# Patient Record
Sex: Male | Born: 1960 | Race: White | Hispanic: No | Marital: Married | Smoking: Never smoker
Health system: Southern US, Community
[De-identification: ages and names within clinical notes are randomized; demographics above are authoritative.]

## PROBLEM LIST (undated history)

## (undated) DIAGNOSIS — C61 Malignant neoplasm of prostate: Secondary | ICD-10-CM

## (undated) DIAGNOSIS — I10 Essential (primary) hypertension: Secondary | ICD-10-CM

## (undated) DIAGNOSIS — J45909 Unspecified asthma, uncomplicated: Secondary | ICD-10-CM

## (undated) HISTORY — PX: VARICOCELE EXCISION: SUR582

## (undated) HISTORY — PX: PROSTATE BIOPSY: SHX241

## (undated) HISTORY — PX: HERNIA REPAIR: SHX51

---

## 2000-08-25 ENCOUNTER — Ambulatory Visit (HOSPITAL_COMMUNITY): Admission: RE | Admit: 2000-08-25 | Discharge: 2000-08-25 | Payer: Self-pay | Admitting: Gastroenterology

## 2000-09-23 ENCOUNTER — Encounter: Payer: Self-pay | Admitting: Surgery

## 2000-09-26 ENCOUNTER — Inpatient Hospital Stay (HOSPITAL_COMMUNITY): Admission: RE | Admit: 2000-09-26 | Discharge: 2000-09-28 | Payer: Self-pay | Admitting: Surgery

## 2014-06-13 ENCOUNTER — Other Ambulatory Visit: Payer: Self-pay | Admitting: Family Medicine

## 2014-06-13 DIAGNOSIS — R946 Abnormal results of thyroid function studies: Secondary | ICD-10-CM

## 2014-06-27 ENCOUNTER — Ambulatory Visit
Admission: RE | Admit: 2014-06-27 | Discharge: 2014-06-27 | Disposition: A | Discharge status: Home / Self Care | Payer: 59 | Source: Ambulatory Visit | Attending: Family Medicine | Admitting: Family Medicine

## 2014-06-27 DIAGNOSIS — R946 Abnormal results of thyroid function studies: Secondary | ICD-10-CM

## 2015-03-17 DIAGNOSIS — L80 Vitiligo: Secondary | ICD-10-CM | POA: Insufficient documentation

## 2015-06-24 ENCOUNTER — Encounter (HOSPITAL_COMMUNITY): Payer: Self-pay | Admitting: *Deleted

## 2015-06-24 ENCOUNTER — Emergency Department (HOSPITAL_COMMUNITY)
Admission: EM | Admit: 2015-06-24 | Discharge: 2015-06-24 | Disposition: A | Discharge status: Home / Self Care | Payer: Managed Care, Other (non HMO) | Attending: Emergency Medicine | Admitting: Emergency Medicine

## 2015-06-24 DIAGNOSIS — W28XXXA Contact with powered lawn mower, initial encounter: Secondary | ICD-10-CM | POA: Diagnosis not present

## 2015-06-24 DIAGNOSIS — Z87891 Personal history of nicotine dependence: Secondary | ICD-10-CM | POA: Diagnosis not present

## 2015-06-24 DIAGNOSIS — Y9289 Other specified places as the place of occurrence of the external cause: Secondary | ICD-10-CM | POA: Insufficient documentation

## 2015-06-24 DIAGNOSIS — Y9389 Activity, other specified: Secondary | ICD-10-CM | POA: Insufficient documentation

## 2015-06-24 DIAGNOSIS — S61213A Laceration without foreign body of left middle finger without damage to nail, initial encounter: Secondary | ICD-10-CM | POA: Diagnosis present

## 2015-06-24 DIAGNOSIS — Z23 Encounter for immunization: Secondary | ICD-10-CM | POA: Insufficient documentation

## 2015-06-24 DIAGNOSIS — I10 Essential (primary) hypertension: Secondary | ICD-10-CM | POA: Insufficient documentation

## 2015-06-24 DIAGNOSIS — Y998 Other external cause status: Secondary | ICD-10-CM | POA: Insufficient documentation

## 2015-06-24 HISTORY — DX: Essential (primary) hypertension: I10

## 2015-06-24 MED ORDER — TETANUS-DIPHTH-ACELL PERTUSSIS 5-2.5-18.5 LF-MCG/0.5 IM SUSP
0.5000 mL | Freq: Once | INTRAMUSCULAR | Status: AC
  Administered 2015-06-24: 0.5 mL via INTRAMUSCULAR
  Filled 2015-06-24: qty 0.5

## 2015-06-24 MED ORDER — OXYCODONE-ACETAMINOPHEN 5-325 MG PO TABS
2.0000 | ORAL_TABLET | Freq: Once | ORAL | Status: AC
  Administered 2015-06-24: 2 via ORAL
  Filled 2015-06-24: qty 2

## 2015-06-24 MED ORDER — CEPHALEXIN 500 MG PO CAPS: 500.0000 mg | ORAL_CAPSULE | Freq: Four times a day (QID) | ORAL | Status: DC

## 2015-06-24 MED ORDER — OXYCODONE-ACETAMINOPHEN 5-325 MG PO TABS: 2.0000 | ORAL_TABLET | Freq: Three times a day (TID) | ORAL | Status: DC | PRN

## 2015-06-24 NOTE — Discharge Instructions (Signed)
Fingertip Injuries and Amputations Follow-up with hand surgery. Take Tylenol or Motrin for pain and Percocet for breakthrough pain. Fingertip injuries are common and often get injured because they are last to escape when pulling your hand out of harm's way. You have amputated (cut off) part of your finger. How this turns out depends largely on how much was amputated. If just the tip is amputated, often the end of the finger will grow back and the finger may return to much the same as it was before the injury.  If more of the finger is missing, your caregiver has done the best with the tissue remaining to allow you to keep as much finger as is possible. Your caregiver after checking your injury has tried to leave you with a painless fingertip that has durable, feeling skin. If possible, your caregiver has tried to maintain the finger's length and appearance and preserve its fingernail.  Please read the instructions outlined below and refer to this sheet in the next few weeks. These instructions provide you with general information on caring for yourself. Your caregiver may also give you specific instructions. While your treatment has been done according to the most current medical practices available, unavoidable complications occasionally occur. If you have any problems or questions after discharge, please call your caregiver. HOME CARE INSTRUCTIONS   You may resume normal diet and activities as directed or allowed.  Keep your hand elevated above the level of your heart. This helps decrease pain and swelling.  Keep ice packs (or a bag of ice wrapped in a towel) on the injured area for 15-20 minutes, 03-04 times per day, for the first two days.  Change dressings if necessary or as directed.  Clean the wound daily or as directed.  Only take over-the-counter or prescription medicines for pain, discomfort, or fever as directed by your caregiver.  Keep appointments as directed. SEEK IMMEDIATE MEDICAL  CARE IF:  You develop redness, swelling, numbness or increasing pain in the wound.  There is pus coming from the wound.  You develop an unexplained oral temperature above 102 F (38.9 C) or as your caregiver suggests.  There is a foul (bad) smell coming from the wound or dressing.  There is a breaking open of the wound (edges not staying together) after sutures or staples have been removed. MAKE SURE YOU:   Understand these instructions.  Will watch your condition.  Will get help right away if you are not doing well or get worse. Document Released: 09/18/2005 Document Revised: 01/20/2012 Document Reviewed: 08/17/2008 [facility] Patient Information 2015 [place], [place]. This information is not intended to replace advice given to you by your health care provider. Make sure you discuss any questions you have with your health care provider.

## 2015-06-24 NOTE — ED Notes (Signed)
Declined W/C at D/C and was escorted to lobby by RN. 

## 2015-06-24 NOTE — ED Notes (Signed)
Pt reports he put his fingers in lawn mower to get grass out.

## 2015-06-24 NOTE — ED Provider Notes (Signed)
History  This chart was scribed for non-physician practitioner, Ottie Glazier, PA-C,working with Lajean Saver, MD, by Marlowe Kays, ED Scribe. This patient was seen in room TR08C/TR08C and the patient's care was started at 2:23 PM.  Chief Complaint  Patient presents with  . Finger Injury   The history is provided by the patient and medical records. No language interpreter was used.    HPI Comments:  Kurt Marquez is a 54 y.o. male who presents to the Emergency Department complaining of a laceration to the third finger of the left hand secondary to pulling grass out of the lawn mower just PTA. He reports severe pain and associated bleeding that has been controlled. He has not taken anything for pain but washed the area with soap and water and applied antibiotic ointment.Marland Kitchen He denies modifying factors. He denies inability to move the fingers, numbness, tingling or weakness of the right hand or fingers, nausea or vomiting. He denies anticoagulant therapy. Pt is left hand dominant. Pt reports his tetanus vaccination is not UTD.  Past Medical History  Diagnosis Date  . Hypertension    History reviewed. No pertinent past surgical history. History reviewed. No pertinent family history. Social History  Substance Use Topics  . Smoking status: Former Research scientist (life sciences)  . Smokeless tobacco: Never Used  . Alcohol Use: No    Review of Systems  Gastrointestinal: Negative for nausea and vomiting.  Skin: Positive for wound.  Neurological: Negative for weakness and numbness.    Allergies  Ivp dye  Home Medications   Prior to Admission medications   Medication Sig Start Date End Date Taking? Authorizing Provider  cephALEXin (KEFLEX) 500 MG capsule Take 1 capsule (500 mg total) by mouth 4 (four) times daily. 06/24/15   Ryenne Lynam Patel-Mills, PA-C  oxyCODONE-acetaminophen (PERCOCET) 5-325 MG per tablet Take 2 tablets by mouth every 8 (eight) hours as needed for severe pain. 06/24/15   Ottie Glazier,  PA-C   Triage Vitals: BP 113/82 mmHg  Pulse 110  Temp(Src) 97.8 F (36.6 C) (Oral)  Resp 20  Ht 5\' 11"  (1.803 m)  Wt 248 lb (112.492 kg)  BMI 34.60 kg/m2  SpO2 97% Physical Exam  Constitutional: He is oriented to person, place, and time. He appears well-developed and well-nourished.  HENT:  Head: Normocephalic and atraumatic.  Eyes: EOM are normal.  Neck: Normal range of motion.  Cardiovascular: Normal rate.   Radial pulse 2+ of LUE  Pulmonary/Chest: Effort normal.  Musculoskeletal: Normal range of motion.  Laceration noted to the tip of the left third digit involving only tip of finger with no bone exposed, only tissue. Nail intact. Able to flex and extend all fingers of the left hand. No pallor to finger.  Neurological: He is alert and oriented to person, place, and time.  Skin: Skin is warm and dry.  Psychiatric: He has a normal mood and affect. His behavior is normal.  Nursing note and vitals reviewed.   ED Course  Procedures (including critical care time) DIAGNOSTIC STUDIES: Oxygen Saturation is 97% on RA, normal by my interpretation.   COORDINATION OF CARE: 2:30 PM- Will dress wound and consult hand specialist. Will update tetanus. Pt verbalizes understanding and agrees to plan.  Medications  oxyCODONE-acetaminophen (PERCOCET/ROXICET) 5-325 MG per tablet 2 tablet (2 tablets Oral Given 06/24/15 1509)  Tdap (BOOSTRIX) injection 0.5 mL (0.5 mLs Intramuscular Given 06/24/15 1509)    Labs Review Labs Reviewed - No data to display  Imaging Review No results found.  EKG Interpretation None      MDM   Final diagnoses:  Laceration of left middle finger w/o foreign body w/o damage to nail, initial encounter  Patient presents for finger tip avulsion. The wound was thoroughly cleaned and soaked in saline.  I spoke to Dr. Caralyn Guile regarding the plan for the patient and he agrees with xeroform and wrapping the finger.  I put the patient on keflex and gave him percocet  for pain.  I discussed return precautions with the patient and he verbally agrees with the plan.   Medications  oxyCODONE-acetaminophen (PERCOCET/ROXICET) 5-325 MG per tablet 2 tablet (2 tablets Oral Given 06/24/15 1509)  Tdap (BOOSTRIX) injection 0.5 mL (0.5 mLs Intramuscular Given 06/24/15 1509)   I personally performed the services described in this documentation, which was scribed in my presence. The recorded information has been reviewed and is accurate.    Ottie Glazier, PA-C 06/24/15 1817  Lajean Saver, MD 06/25/15 [phone]

## 2016-02-02 DIAGNOSIS — L249 Irritant contact dermatitis, unspecified cause: Secondary | ICD-10-CM | POA: Insufficient documentation

## 2016-02-02 DIAGNOSIS — L509 Urticaria, unspecified: Secondary | ICD-10-CM | POA: Insufficient documentation

## 2018-06-03 DIAGNOSIS — B078 Other viral warts: Secondary | ICD-10-CM | POA: Insufficient documentation

## 2018-07-15 ENCOUNTER — Ambulatory Visit
Admission: RE | Admit: 2018-07-15 | Discharge: 2018-07-15 | Disposition: A | Discharge status: Home / Self Care | Payer: 59 | Source: Ambulatory Visit | Attending: Family Medicine | Admitting: Family Medicine

## 2018-07-15 ENCOUNTER — Other Ambulatory Visit: Payer: Self-pay | Admitting: Family Medicine

## 2018-07-15 DIAGNOSIS — M79672 Pain in left foot: Secondary | ICD-10-CM

## 2018-10-13 DIAGNOSIS — M722 Plantar fascial fibromatosis: Secondary | ICD-10-CM | POA: Insufficient documentation

## 2018-10-13 DIAGNOSIS — M79672 Pain in left foot: Secondary | ICD-10-CM | POA: Insufficient documentation

## 2019-07-22 DIAGNOSIS — M546 Pain in thoracic spine: Secondary | ICD-10-CM | POA: Insufficient documentation

## 2019-09-06 DIAGNOSIS — M25511 Pain in right shoulder: Secondary | ICD-10-CM | POA: Insufficient documentation

## 2019-11-16 ENCOUNTER — Encounter: Payer: Self-pay | Admitting: *Deleted

## 2019-11-16 ENCOUNTER — Other Ambulatory Visit: Payer: Self-pay | Admitting: Urology

## 2019-11-16 DIAGNOSIS — C61 Malignant neoplasm of prostate: Secondary | ICD-10-CM

## 2019-11-23 ENCOUNTER — Ambulatory Visit: Payer: 59

## 2019-11-23 ENCOUNTER — Ambulatory Visit: Payer: 59 | Admitting: Radiation Oncology

## 2019-11-29 NOTE — Progress Notes (Signed)
GU Location of Tumor / Histology: prostatic adenocarcinoma  If Prostate Cancer, Gleason Score is (3 + 4) and PSA is (2.39). Prostate volume: 60g  LOGYN CRAIG had two cases of prostatitis in 2007. Then, an abnormal DRE prompted a prostate biopsy on 11/03/2019.  Biopsies of prostate (if applicable) revealed:   Past/Anticipated interventions by urology, if any: prostate biopsy, referral to Dr. Alinda Money to discuss surgery, referral to Dr. Tammi Klippel reference to discuss radiotherapy options  Past/Anticipated interventions by medical oncology, if any: no  Weight changes, if any: no  Bowel/Bladder complaints, if any: IPSS 1. SHIM 25. Denies dysuria, hematuria, urinary leakage or incontinence.  Reports inflamed hemorrhoids. Reports bright red blood in bowel movements.   Nausea/Vomiting, if any: no  Pain issues, if any:  Related to hemorrhoids. Reports using wife's hydrocortisone 2.5 % cream 2-4 times daily for relief. Questions if Dr. Tammi Klippel will provide him his own prescription.  SAFETY ISSUES:  Prior radiation? no  Pacemaker/ICD? no  Possible current pregnancy? no, male patient  Is the patient on methotrexate? no  Current Complaints / other details:  59 year old male. Married. Father dx at the age of 20. Patient's father had a seed implant with complications. Maternal uncle had pancreatic cancer.  Scheduled to speak with Dr. Alinda Money about surgical options at 1100.  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 Patient questions if he will be able to continue to do this.  Sander Nephew

## 2019-11-30 ENCOUNTER — Ambulatory Visit
Admission: RE | Admit: 2019-11-30 | Discharge: 2019-11-30 | Disposition: A | Discharge status: Home / Self Care | Payer: Managed Care, Other (non HMO) | Source: Ambulatory Visit | Attending: Radiation Oncology | Admitting: Radiation Oncology

## 2019-11-30 ENCOUNTER — Other Ambulatory Visit: Payer: Self-pay

## 2019-11-30 ENCOUNTER — Encounter: Payer: Self-pay | Admitting: Medical Oncology

## 2019-11-30 ENCOUNTER — Encounter: Payer: Self-pay | Admitting: Radiation Oncology

## 2019-11-30 VITALS — Ht 70.0 in | Wt 255.0 lb

## 2019-11-30 DIAGNOSIS — C61 Malignant neoplasm of prostate: Secondary | ICD-10-CM

## 2019-11-30 HISTORY — DX: Malignant neoplasm of prostate: C61

## 2019-11-30 NOTE — Progress Notes (Signed)
See progress notes under physician encounter. 

## 2019-11-30 NOTE — Progress Notes (Signed)
Radiation Oncology         [phone] ________________________________  Initial outpatient Consultation - Conducted via Telephone due to current COVID-19 concerns for limiting patient exposure  Name: Kurt Marquez MRN: [id number]  Date: 11/30/2019  DOB: 05-25-1961  [id number], Kurt Luo, MD  Lawerance Cruel, MD   REFERRING PHYSICIAN: Lawerance Cruel, MD  DIAGNOSIS: 59 y.o. 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    ICD-10-CM   1. Malignant neoplasm of prostate ([place])  C61     HISTORY OF PRESENT ILLNESS: Kurt Marquez is a 59 y.o. male with a diagnosis of prostate cancer. He was noted to have a prostate nodule by his primary care physician, Dr. Harrington Challenger. His PSA, however, has remained normal, most recently at 2.39.  Accordingly, he was referred for evaluation in urology by Dr. Karsten Ro on 10/14/2019,  digital rectal examination was performed at that time revealing a firm, linear ridge running the length of his right lobe in the mid to lateral aspect. Based on his abnormal DRE, he was recommended to proceed with prostate biopsy but elected to seek a second opinion from Dr. Jacalyn Lefevre and following that visit, agreed to proceed with biopsy. The patient proceeded to transrectal ultrasound with 12 biopsies of the prostate on 11/03/2019.  The prostate volume measured 60.21 cc.  Out of 12 core biopsies, 3 were positive.  The maximum Gleason score was 3+4, and this was seen in the right apex with perineural invasion. Additionally, Gleason 3+3 was seen in the right mid and right apex lateral.  He is scheduled for a consult visit with Dr. Alinda Money later this morning to further discuss his treatment options, specifically robotic prostatectomy. He is also scheduled for prostate MRI on 12/28/2019 for pre-surgical planning.  He reports a family history of prostate cancer in his father who was diagnosed at age 60 and elected treatment with  radioactive seed implant. His father has not had a recurrence of his cancer but unfortunately developed complications several years following the seed implant procedure resulting in what sounds like a neurogenic bladder requiring chronic foley catheterization at this point.  The patient reviewed the biopsy results with his urologist and he has kindly been referred today for discussion of potential radiation treatment options.  PREVIOUS RADIATION THERAPY: No  PAST MEDICAL HISTORY:  Past Medical History:  Diagnosis Date  . Hypertension   . Prostate cancer ([place])       PAST SURGICAL HISTORY: Past Surgical History:  Procedure Laterality Date  . HERNIA REPAIR    . PROSTATE BIOPSY    . VARICOCELE EXCISION      FAMILY HISTORY:  Family History  Problem Relation Age of Onset  . Prostate cancer Father   . Pancreatic cancer Maternal Uncle 75  . Breast cancer Neg Hx   . Colon cancer Neg Hx     SOCIAL HISTORY:  Social History   Socioeconomic History  . Marital status: Married    Spouse name: Levada Dy  . Number of children: 3  . Years of education: Not on file  . Highest education level: Not on file  Occupational History  . Occupation: [employer]    Comment: trade manager/full time  Tobacco Use  . Smoking status: Never Smoker  . Smokeless tobacco: Never Used  Substance and Sexual Activity  . Alcohol use: No  . Drug use: No  . Sexual activity: Yes  Other Topics Concern  .  Not on file  Social History Narrative  . Not on file   Social Determinants of Health   Financial Resource Strain:   . Difficulty of Paying Living Expenses: Not on file  Food Insecurity:   . Worried About Charity fundraiser in the Last Year: Not on file  . Ran Out of Food in the Last Year: Not on file  Transportation Needs:   . Lack of Transportation (Medical): Not on file  . Lack of Transportation (Non-Medical): Not on file  Physical Activity:   . Days of Exercise per Week: Not on file  .  Minutes of Exercise per Session: Not on file  Stress:   . Feeling of Stress : Not on file  Social Connections:   . Frequency of Communication with Friends and Family: Not on file  . Frequency of Social Gatherings with Friends and Family: Not on file  . Attends Religious Services: Not on file  . Active Member of Clubs or Organizations: Not on file  . Attends Archivist Meetings: Not on file  . Marital Status: Not on file  Intimate Partner Violence:   . Fear of Current or Ex-Partner: Not on file  . Emotionally Abused: Not on file  . Physically Abused: Not on file  . Sexually Abused: Not on file    ALLERGIES: Ivp dye [iodinated diagnostic agents], Metrizamide, Iodine-131, and Pollen extract  MEDICATIONS:  Current Outpatient Medications  Medication Sig Dispense Refill  . clobetasol cream (TEMOVATE) 0.05 % Apply to affected areas on body and hands twice daily for 4 wks, then taper back to tacrolimus. Not to face.    Marland Kitchen econazole nitrate 1 % cream Apply to groin for itching daily to twice daily as needed for fungal outbreaks.    Marland Kitchen lisinopril (ZESTRIL) 20 MG tablet lisinopril 20 mg tablet    . meclizine (ANTIVERT) 25 MG tablet meclizine 25 mg tablet    . metroNIDAZOLE (METROGEL) 0.75 % gel APPLY TO FACE TWICE DAILY AS NEEDED    . omeprazole (PRILOSEC) 20 MG capsule Take by mouth.    . tacrolimus (PROTOPIC) 0.1 % ointment tacrolimus 0.1 % topical ointment  APPLY OINTMENT EXTERNALLY TO AFFECTED AREA TWICE DAILY MONDAY THROUGH FRIDAY    . triamcinolone cream (KENALOG) 0.1 % Apply topically 2 (two) times daily.     No current facility-administered medications for this encounter.    REVIEW OF SYSTEMS:  On review of systems, the patient reports that he is doing well overall. He denies any chest pain, shortness of breath, cough, fevers, chills, night sweats, unintended weight changes. He denies any bowel disturbances, and denies abdominal pain, nausea or vomiting. He denies any new  musculoskeletal or joint aches or pains. His IPSS was 1, indicating very mild to no urinary symptoms. He reports inflamed hemorrhoids, which cause bright red blood in his bowel movements. His SHIM was 25, indicating he does not have erectile dysfunction. A complete review of systems is obtained and is otherwise negative.    PHYSICAL EXAM:  Wt Readings from Last 3 Encounters:  11/30/19 255 lb (115.7 kg)  06/24/15 248 lb (112.5 kg)   Temp Readings from Last 3 Encounters:  06/24/15 97.7 F (36.5 C) (Oral)   BP Readings from Last 3 Encounters:  06/24/15 121/81   Pulse Readings from Last 3 Encounters:  06/24/15 89   Pain Assessment Pain Score: 0-No pain/10  Physical exam not performed in light of telephone consult visit format.   KPS = 100  100 - Normal; no complaints; no evidence of disease. 90   - Able to carry on normal activity; minor signs or symptoms of disease. 80   - Normal activity with effort; some signs or symptoms of disease. 4   - Cares for self; unable to carry on normal activity or to do active work. 60   - Requires occasional assistance, but is able to care for most of his personal needs. 50   - Requires considerable assistance and frequent medical care. 26   - Disabled; requires special care and assistance. 78   - Severely disabled; hospital admission is indicated although death not imminent. 73   - Very sick; hospital admission necessary; active supportive treatment necessary. 10   - Moribund; fatal processes progressing rapidly. 0     - Dead  Karnofsky DA, Abelmann WH, Craver LS and Burchenal JH [phone]) The use of the nitrogen mustards in the palliative treatment of carcinoma: with particular reference to bronchogenic carcinoma Cancer 1 634-56  LABORATORY DATA:  No results found for: WBC, HGB, HCT, MCV, PLT No results found for: NA, K, CL, CO2 No results found for: ALT, AST, GGT, ALKPHOS, BILITOT   RADIOGRAPHY: No results found.    IMPRESSION/PLAN: This  visit was conducted via Telephone to spare the patient unnecessary potential exposure in the healthcare setting during the current COVID-19 pandemic. 1. 59 y.o. gentleman with Stage T2a adenocarcinoma of the prostate with Gleason Score of 3+4, and PSA of 2.39. We discussed the patient's workup and outlined the nature of prostate cancer in this setting. The patient's T stage, Gleason's score, and PSA put him into the favorable intermediate risk group. Accordingly, he is eligible for a variety of potential treatment options including brachytherapy, 5.5 weeks of external radiation or prostatectomy. We discussed the available radiation techniques, and focused on the details and logistics of delivery. We discussed and outlined the risks, benefits, short and long-term effects associated with radiotherapy and compared and contrasted these with prostatectomy.  He and his wife were encouraged to ask questions that were answered to their stated satisfaction.  The patient focused most of his questions and interest in robotic-assisted laparoscopic radical prostatectomy.  We discussed some of the potential advantages of surgery including surgical staging, the availability of salvage radiotherapy to the prostatic fossa, and the confidence associated with immediate biochemical response.  We discussed some of the potential proven indications for postoperative radiotherapy including positive margins, extracapsular extension, and seminal vesicle involvement. We also talked about some of the other potential findings leading to a recommendation for radiotherapy including a non-zero postoperative PSA and positive lymph nodes.   At the end of the conversation the patient is leaning towards moving forward with prostatectomy and will discuss this further with Dr. Alinda Money at the time of his consult visit this morning. We will share our discussion with Dr. Claudia Desanctis and Dr. Alinda Money so that they can move forward with treatment planning  accordingly. We enjoyed meeting with him and his wife today, and will look forward to following his progress.  Of course, we would be more than happy to continue to participate in his care should he elect to proceed with radiotherapy now or should there be any clinical indication for adjuvant or salvage radiotherapy in the future. He knows that he is welcome to call at any time with any further questions or concerns.  Given current concerns for patient exposure during the COVID-19 pandemic, this encounter was conducted via telephone. The patient was notified in  advance and was offered a MyChart meeting to allow for face to face communication but unfortunately reported that he did not have the appropriate resources/technology to support such a visit and instead preferred to proceed with telephone consult. The patient has given verbal consent for this type of encounter. The time spent during this encounter was 60 minutes. The attendants for this meeting include Tyler Pita MD, Ashlyn Bruning PA-C, [place], and patient, ANDREWJAMES RODELA and his wife Morey Hummingbird. During the encounter, Tyler Pita MD, Ashlyn Bruning PA-C, and scribe, Wilburn Mylar were located at [place].  Patient, KAIHAN DIMARCO and his wife Morey Hummingbird were located at home.    Nicholos Johns, PA-C    Tyler Pita, MD  [facility] Direct Dial: [phone]  Fax: [phone] [email or web address]  Skype  [email or web address]  This document serves as a record of services personally performed by Tyler Pita, MD and Freeman Caldron, PA-C. It was created on their behalf by Wilburn Mylar, a trained medical scribe. The creation of this record is based on the scribe's personal observations and the provider's statements to them. This document has been checked and approved by the attending provider.

## 2019-12-01 DIAGNOSIS — C61 Malignant neoplasm of prostate: Secondary | ICD-10-CM | POA: Insufficient documentation

## 2019-12-06 ENCOUNTER — Other Ambulatory Visit: Payer: Self-pay | Admitting: Urology

## 2019-12-28 ENCOUNTER — Ambulatory Visit
Admission: RE | Admit: 2019-12-28 | Discharge: 2019-12-28 | Disposition: A | Discharge status: Home / Self Care | Payer: Managed Care, Other (non HMO) | Source: Ambulatory Visit | Attending: Urology | Admitting: Urology

## 2019-12-28 DIAGNOSIS — C61 Malignant neoplasm of prostate: Secondary | ICD-10-CM

## 2019-12-28 MED ORDER — GADOBENATE DIMEGLUMINE 529 MG/ML IV SOLN
20.0000 mL | Freq: Once | INTRAVENOUS | Status: AC | PRN
  Administered 2019-12-28: 20 mL via INTRAVENOUS

## 2020-01-05 NOTE — Patient Instructions (Addendum)
DUE TO COVID-19 ONLY ONE VISITOR IS ALLOWED TO COME WITH YOU AND STAY IN THE WAITING ROOM ONLY DURING PRE OP AND PROCEDURE DAY OF SURGERY. THE 1 VISITOR MAY VISIT WITH YOU AFTER SURGERY IN YOUR PRIVATE ROOM DURING VISITING HOURS ONLY!  YOU NEED TO HAVE A COVID 19 TEST ON 01-10-20 @ 1:05 PM, THIS TEST MUST BE DONE BEFORE SURGERY, COME  [address], [place] , [id number].  ([place]) ONCE YOUR COVID TEST IS COMPLETED, PLEASE BEGIN THE QUARANTINE INSTRUCTIONS AS OUTLINED IN YOUR HANDOUT.                RAIMON DEPPEN  01/05/2020   Your procedure is scheduled on: 01-13-20   Report to [facility] Main  Entrance    Report to Short Stay at 5:30 AM     Call this number if you have problems the morning of surgery [phone]    Remember: Do not eat food or drink liquids :After Midnight.     Take these medicines the morning of surgery with A SIP OF WATER: None   BRUSH YOUR TEETH MORNING OF SURGERY AND RINSE YOUR MOUTH OUT, NO CHEWING GUM CANDY OR MINTS.                                You may not have any metal on your body including hair pins and              piercings     Do not wear jewelry,cologne, lotions, powders or  deodorant                        Men may shave face and neck.   Do not bring valuables to the hospital. [place].  Contacts, dentures or bridgework may not be worn into surgery.  You may bring in overnight bag     Special Instructions: Please follow your prep, per your surgeon's instructions              Please read over the following fact sheets you were given: _____________________________________________________________________             [facility] - Preparing for Surgery Before surgery, you can play an important role.  Because skin is not sterile, your skin needs to be as free of germs as possible.  You can reduce the number of germs on your skin by washing with CHG  (chlorahexidine gluconate) soap before surgery.  CHG is an antiseptic cleaner which kills germs and bonds with the skin to continue killing germs even after washing. Please DO NOT use if you have an allergy to CHG or antibacterial soaps.  If your skin becomes reddened/irritated stop using the CHG and inform your nurse when you arrive at Short Stay. Do not shave (including legs and underarms) for at least 48 hours prior to the first CHG shower.  You may shave your face/neck. Please follow these instructions carefully:  1.  Shower with CHG Soap the night before surgery and the  morning of Surgery.  2.  If you choose to wash your hair, wash your hair first as usual with your  normal  shampoo.  3.  After you shampoo, rinse your hair and body thoroughly to remove the  shampoo.  4.  Use CHG as you would any other liquid soap.  You can apply chg directly  to the skin and wash                       Gently with a scrungie or clean washcloth.  5.  Apply the CHG Soap to your body ONLY FROM THE NECK DOWN.   Do not use on face/ open                           Wound or open sores. Avoid contact with eyes, ears mouth and genitals (private parts).                       Wash face,  Genitals (private parts) with your normal soap.             6.  Wash thoroughly, paying special attention to the area where your surgery  will be performed.  7.  Thoroughly rinse your body with warm water from the neck down.  8.  DO NOT shower/wash with your normal soap after using and rinsing off  the CHG Soap.                9.  Pat yourself dry with a clean towel.            10.  Wear clean pajamas.            11.  Place clean sheets on your bed the night of your first shower and do not  sleep with pets. Day of Surgery : Do not apply any lotions/deodorants the morning of surgery.  Please wear clean clothes to the hospital/surgery center.  FAILURE TO FOLLOW THESE INSTRUCTIONS MAY RESULT IN THE CANCELLATION OF  YOUR SURGERY PATIENT SIGNATURE_________________________________  NURSE SIGNATURE__________________________________  ________________________________________________________________________

## 2020-01-05 NOTE — Progress Notes (Signed)
PCP - Lona Kettle Cardiologist -   Chest x-ray -  EKG -  Stress Test -  ECHO -  Cardiac Cath -   Sleep Study -  CPAP -   Fasting Blood Sugar -  Checks Blood Sugar _____ times a day  Blood Thinner Instructions: Aspirin Instructions: Last Dose:  Anesthesia review:   Patient denies shortness of breath, fever, cough and chest pain at PAT appointment   Patient verbalized understanding of instructions that were given to them at the PAT appointment. Patient was also instructed that they will need to review over the PAT instructions again at home before surgery.

## 2020-01-06 ENCOUNTER — Other Ambulatory Visit: Payer: Self-pay

## 2020-01-06 ENCOUNTER — Encounter (HOSPITAL_COMMUNITY): Payer: Self-pay

## 2020-01-06 ENCOUNTER — Encounter (HOSPITAL_COMMUNITY)
Admission: RE | Admit: 2020-01-06 | Discharge: 2020-01-06 | Disposition: A | Discharge status: Home / Self Care | Payer: 59 | Source: Ambulatory Visit | Attending: Urology | Admitting: Urology

## 2020-01-06 DIAGNOSIS — Z01810 Encounter for preprocedural cardiovascular examination: Secondary | ICD-10-CM | POA: Diagnosis not present

## 2020-01-06 DIAGNOSIS — I10 Essential (primary) hypertension: Secondary | ICD-10-CM | POA: Insufficient documentation

## 2020-01-06 DIAGNOSIS — Z01812 Encounter for preprocedural laboratory examination: Secondary | ICD-10-CM | POA: Diagnosis not present

## 2020-01-06 HISTORY — DX: Unspecified asthma, uncomplicated: J45.909

## 2020-01-06 LAB — BASIC METABOLIC PANEL
Anion gap: 9 (ref 5–15)
BUN: 16 mg/dL (ref 6–20)
CO2: 24 mmol/L (ref 22–32)
Calcium: 9.1 mg/dL (ref 8.9–10.3)
Chloride: 106 mmol/L (ref 98–111)
Creatinine, Ser: 0.94 mg/dL (ref 0.61–1.24)
GFR calc Af Amer: >60 mL/min (ref >60)
GFR calc non Af Amer: >60 mL/min (ref >60)
Glucose, Bld: 102 mg/dL — ABNORMAL HIGH (ref 70–99)
Potassium: 4.3 mmol/L (ref 3.5–5.1)
Sodium: 139 mmol/L (ref 135–145)

## 2020-01-06 LAB — CBC
HCT: 46.8 % (ref 39.0–52.0)
Hemoglobin: 14.9 g/dL (ref 13.0–17.0)
MCH: 29 pg (ref 26.0–34.0)
MCHC: 31.8 g/dL (ref 30.0–36.0)
MCV: 91.2 fL (ref 80.0–100.0)
Platelets: 314 10*3/uL (ref 150–400)
RBC: 5.13 MIL/uL (ref 4.22–5.81)
RDW: 13.2 % (ref 11.5–15.5)
WBC: 7.2 10*3/uL (ref 4.0–10.5)
nRBC: 0 % (ref 0.0–0.2)

## 2020-01-06 LAB — ABO/RH: ABO/RH(D): A POS

## 2020-01-10 ENCOUNTER — Other Ambulatory Visit (HOSPITAL_COMMUNITY)
Admission: RE | Admit: 2020-01-10 | Discharge: 2020-01-10 | Disposition: A | Discharge status: Home / Self Care | Payer: 59 | Source: Ambulatory Visit | Attending: Urology | Admitting: Urology

## 2020-01-10 DIAGNOSIS — Z01812 Encounter for preprocedural laboratory examination: Secondary | ICD-10-CM | POA: Insufficient documentation

## 2020-01-10 DIAGNOSIS — Z20822 Contact with and (suspected) exposure to covid-19: Secondary | ICD-10-CM | POA: Insufficient documentation

## 2020-01-10 LAB — SARS CORONAVIRUS 2 (TAT 6-24 HRS): SARS Coronavirus 2: NEGATIVE

## 2020-01-12 NOTE — H&P (Signed)
CC: Prostate Cancer    Kurt Marquez is a 59 year old gentleman who was noted to have an abnormal DRE by Dr. Harrington Challenger during a routine physical exam. His PSA was 2.39. He did have a father who had prostate cancer treated with a radiation seed implantation at age 79. He was seen by Dr. Karsten Ro in consultation and was confirmed to have concerning firmness of the right mid lateral prostate gland. He was recommend to undergo a prostate biopsy. Kurt Marquez sought a second opinion by Dr. Claudia Desanctis who also recommended a prostate biopsy. He ultimately proceeded with a biopsy on 11/03/19. He was diagnosed with Gleason 3+4=7 adenocarcinoma with 3 out of 13 biopsies positive for malignancy. Only 5% of one core demonstrated pattern 4 disease. He is very well informed through his prior discussions with Dr. Claudia Desanctis and Dr. Tammi Klippel, who he talked with earlier this morning.   Family history: Father - treated with radiation seed implant at age 42   Imaging studies: He has been scheduled for an MRI of the prostate.   PMH: He has a history of hypertension, GERD, vertigo, and asthma. He has an IV CONTRAST ALLERGY.  PSH: Laparoscopic Nissen fundoplication and varicocelectomy.   TNM stage: cT2 Nx Mx  PSA: 2.39  Gleason score: 3+4=7 (Grade group 2)  Biopsy (11/03/19): 3/13 cores positive  Left: Benign  Right: R apex (44%, 3+4=7), R lateral apex (13%, 3+3=6), R mid (13%, 3+3=6)  Prostate volume: 60.2 cc  PSAD: 0.04   Nomogram  OC disease: 78%  EPE: 19%  SVI: 1%  LNI: 2%  PFS (5 year, 10 year): 93%, 88%   Urinary function: IPSS is 2.  Erectile function: SHIM score is 23.     ALLERGIES: Iodinated Contrast Media - Oral and IV Dye - overheated    MEDICATIONS: Doxycycline Hyclate  Lisinopril 20 mg tablet  Prilosec  Meclizine Hcl     GU PSH: Prostate Needle Biopsy - 11/03/2019     NON-GU PSH: Remove Leg Veins/lesion - 1985 Surgical Pathology, Gross And Microscopic Examination For Prostate Needle - 11/03/2019      GU PMH: Prostate Cancer, Discussed patient's pathology report which revealed Gleason 3 + 4 prostate cancer. We discussed the different nomenclature including grade groups and risk stratification group. Patient understands that he is in grade group 2 or favorable intermediate risk prostate cancer. Patient was given a copy of his pathology report, an informational handout on localized prostate cancer as well as [facility] pre medical prostatectomy nomogram. We discussed treatment options for the patient which would include active surveillance, radical prostatectomy or primary radiation. Patient is extremely nervous and would not be a good candidate for active surveillance. We discussed the details of her robotic radical prostatectomy and lymph node dissection including postoperative course, Foley catheter, urinary incontinence and erectile dysfunction. Patient is interested in gather more information and would like to talk to the radiation oncologist as well. In addition I will obtain MRI of the prostate in approximately 6 weeks for either surgical or radiation planning. - 11/15/2019 Prostate nodule w/o LUTS (Stable), I had an in-depth conversation with the patient's wife regarding PSA screening, abnormal DRE, PSA, prostate cancer, BPH. I answered all patient questions including the the timing and urgency of biopsy, treatment, affects, autoimmune disease, UV treatments, when to return to work, exercise, diet, intercourse. The encounter was greater than 40 minutes in length and over 50% of the encounter was spent counseling. Patient is scheduled for prostate biopsy On 11/03/2019 with Dr.  Ottelin. - 10/21/2019, Although I do not have any previous PSA results the patient reports there has been a slight elevation compared to his PSA previously. I told him that despite the fact that his PSA remains within the normal range he has change in his prostate exam that is fairly worrisome and therefore I have recommended further  evaluation with TRUS/Bx. He understands and has elected to proceed., - 10/14/2019 Family Hx of Prostate Cancer, His father has had prostate cancer that was clinically significant and treated with radioactive seeds when he was 3. - 10/14/2019    NON-GU PMH: Asthma GERD Hypertension    FAMILY HISTORY: Prostate Cancer - Father Thyroid - Mother   SOCIAL HISTORY: Marital Status: Married Current Smoking Status: Patient has never smoked.   Tobacco Use Assessment Completed: Used Tobacco in last 30 days? Has never drank.  Drinks 1 caffeinated drink per day. Patient's occupation Product/process development scientist trade [employer]    REVIEW OF SYSTEMS:    GU Review Male:   Patient reports leakage of urine. Patient denies frequent urination, hard to postpone urination, burning/ pain with urination, get up at night to urinate, stream starts and stops, trouble starting your streams, and have to strain to urinate .  Gastrointestinal (Lower):   Patient denies diarrhea and constipation.  Gastrointestinal (Upper):   Patient denies nausea and vomiting.  Constitutional:   Patient denies fever, night sweats, weight loss, and fatigue.  Skin:   Patient denies skin rash/ lesion and itching.  Eyes:   Patient denies blurred vision and double vision.  Ears/ Nose/ Throat:   Patient denies sore throat and sinus problems.  Hematologic/Lymphatic:   Patient denies swollen glands and easy bruising.  Cardiovascular:   Patient denies leg swelling and chest pains.  Respiratory:   Patient denies cough and shortness of breath.  Endocrine:   Patient denies excessive thirst.  Musculoskeletal:   Patient denies back pain and joint pain.  Neurological:   Patient denies headaches and dizziness.  Psychologic:   Patient denies depression and anxiety.   Notes: hemorrhoids     VITAL SIGNS:     Weight 155 lb / 70.31 kg  Height 70.5 in / 179.07 cm  BMI 21.9 kg/m    MULTI-SYSTEM PHYSICAL EXAMINATION:    Constitutional:  Well-nourished. No physical deformities. Normally developed. Good grooming.  Neck: Neck symmetrical, not swollen. Normal tracheal position.  Respiratory: No labored breathing, no use of accessory muscles. Clear bilaterally.  Cardiovascular: Normal temperature, normal extremity pulses, no swelling, no varicosities. Regular rate and rhythm.  Lymphatic: No enlargement of neck, axillae, groin.  Skin: No paleness, no jaundice, no cyanosis. No lesion, no ulcer, no rash.  Neurologic / Psychiatric: Oriented to time, oriented to place, oriented to person. No depression, no anxiety, no agitation.  Gastrointestinal: No mass, no tenderness, no rigidity, non obese abdomen.  Eyes: Normal conjunctivae. Normal eyelids.  Ears, Nose, Mouth, and Throat: Left ear no scars, no lesions, no masses. Right ear no scars, no lesions, no masses. Nose no scars, no lesions, no masses. Normal hearing. Normal lips.  Musculoskeletal: Normal gait and station of head and neck.        ASSESSMENT:      ICD-10 Details  1 GU:   Prostate Cancer - C61    PLAN:         1. Favorable intermediate risk prostate cancer:  He has made the decision that he would like to proceed with surgical therapy for his prostate cancer. He does  have an MRI scheduled for February 16th and will keep that appointment considering his palpable disease for preoperative planning purposes. He will tentatively be scheduled for a bilateral nerve-sparing robot assisted laparoscopic radical prostatectomy and bilateral pelvic lymph node dissection pending his MRI result.

## 2020-01-13 ENCOUNTER — Other Ambulatory Visit: Payer: Self-pay

## 2020-01-13 ENCOUNTER — Encounter (HOSPITAL_COMMUNITY)
Admission: RE | Disposition: A | Discharge status: Home / Self Care | Payer: Self-pay | Source: Home / Self Care | Attending: Urology

## 2020-01-13 ENCOUNTER — Ambulatory Visit (HOSPITAL_COMMUNITY): Payer: 59 | Admitting: Physician Assistant

## 2020-01-13 ENCOUNTER — Observation Stay (HOSPITAL_COMMUNITY)
Admission: RE | Admit: 2020-01-13 | Discharge: 2020-01-14 | Disposition: A | Discharge status: Home / Self Care | Payer: 59 | Attending: Urology | Admitting: Urology

## 2020-01-13 ENCOUNTER — Encounter (HOSPITAL_COMMUNITY): Payer: Self-pay | Admitting: Urology

## 2020-01-13 ENCOUNTER — Ambulatory Visit (HOSPITAL_COMMUNITY): Payer: 59 | Admitting: Certified Registered Nurse Anesthetist

## 2020-01-13 DIAGNOSIS — Z6835 Body mass index (BMI) 35.0-35.9, adult: Secondary | ICD-10-CM | POA: Diagnosis not present

## 2020-01-13 DIAGNOSIS — E669 Obesity, unspecified: Secondary | ICD-10-CM | POA: Diagnosis not present

## 2020-01-13 DIAGNOSIS — C61 Malignant neoplasm of prostate: Principal | ICD-10-CM | POA: Insufficient documentation

## 2020-01-13 DIAGNOSIS — J45909 Unspecified asthma, uncomplicated: Secondary | ICD-10-CM | POA: Insufficient documentation

## 2020-01-13 DIAGNOSIS — Z8042 Family history of malignant neoplasm of prostate: Secondary | ICD-10-CM | POA: Insufficient documentation

## 2020-01-13 DIAGNOSIS — K219 Gastro-esophageal reflux disease without esophagitis: Secondary | ICD-10-CM | POA: Diagnosis not present

## 2020-01-13 DIAGNOSIS — Z79899 Other long term (current) drug therapy: Secondary | ICD-10-CM | POA: Diagnosis not present

## 2020-01-13 DIAGNOSIS — I10 Essential (primary) hypertension: Secondary | ICD-10-CM | POA: Diagnosis not present

## 2020-01-13 HISTORY — PX: LYMPHADENECTOMY: SHX5960

## 2020-01-13 HISTORY — PX: ROBOT ASSISTED LAPAROSCOPIC RADICAL PROSTATECTOMY: SHX5141

## 2020-01-13 LAB — TYPE AND SCREEN
ABO/RH(D): A POS
Antibody Screen: NEGATIVE

## 2020-01-13 LAB — HEMOGLOBIN AND HEMATOCRIT, BLOOD
HCT: 45 % (ref 39.0–52.0)
Hemoglobin: 14 g/dL (ref 13.0–17.0)

## 2020-01-13 SURGERY — XI ROBOTIC ASSISTED LAPAROSCOPIC RADICAL PROSTATECTOMY LEVEL 2
Anesthesia: General

## 2020-01-13 MED ORDER — LISINOPRIL 20 MG PO TABS
20.0000 mg | ORAL_TABLET | Freq: Every day | ORAL | Status: DC
  Administered 2020-01-13: 20 mg via ORAL
  Filled 2020-01-13: qty 1

## 2020-01-13 MED ORDER — ROCURONIUM BROMIDE 10 MG/ML (PF) SYRINGE
PREFILLED_SYRINGE | INTRAVENOUS | Status: DC | PRN
  Administered 2020-01-13: 60 mg via INTRAVENOUS
  Administered 2020-01-13: 20 mg via INTRAVENOUS

## 2020-01-13 MED ORDER — PROPOFOL 10 MG/ML IV BOLUS
INTRAVENOUS | Status: AC
  Filled 2020-01-13: qty 20

## 2020-01-13 MED ORDER — PROPOFOL 10 MG/ML IV BOLUS
INTRAVENOUS | Status: DC | PRN
  Administered 2020-01-13: 200 mg via INTRAVENOUS
  Administered 2020-01-13 (×4): 50 mg via INTRAVENOUS

## 2020-01-13 MED ORDER — SUGAMMADEX SODIUM 200 MG/2ML IV SOLN
INTRAVENOUS | Status: DC | PRN
  Administered 2020-01-13: 300 mg via INTRAVENOUS

## 2020-01-13 MED ORDER — MIDAZOLAM HCL 2 MG/2ML IJ SOLN
INTRAMUSCULAR | Status: AC
  Filled 2020-01-13: qty 2

## 2020-01-13 MED ORDER — SUCCINYLCHOLINE CHLORIDE 200 MG/10ML IV SOSY
PREFILLED_SYRINGE | INTRAVENOUS | Status: DC | PRN
  Administered 2020-01-13: 140 mg via INTRAVENOUS

## 2020-01-13 MED ORDER — KCL IN DEXTROSE-NACL 20-5-0.45 MEQ/L-%-% IV SOLN
INTRAVENOUS | Status: DC
  Filled 2020-01-13 (×3): qty 1000

## 2020-01-13 MED ORDER — OXYCODONE HCL 5 MG/5ML PO SOLN: 5.0000 mg | Freq: Once | ORAL | Status: DC | PRN

## 2020-01-13 MED ORDER — MAGNESIUM CITRATE PO SOLN: 1.0000 | Freq: Once | ORAL | Status: DC

## 2020-01-13 MED ORDER — STERILE WATER FOR IRRIGATION IR SOLN
Status: DC | PRN
  Administered 2020-01-13: 1000 mL

## 2020-01-13 MED ORDER — SODIUM CHLORIDE 0.9 % IV BOLUS
1000.0000 mL | Freq: Once | INTRAVENOUS | Status: AC
  Administered 2020-01-13: 11:00:00 1000 mL via INTRAVENOUS

## 2020-01-13 MED ORDER — ONDANSETRON HCL 4 MG/2ML IJ SOLN: 4.0000 mg | INTRAMUSCULAR | Status: DC | PRN

## 2020-01-13 MED ORDER — SODIUM CHLORIDE 0.9 % IR SOLN
Status: DC | PRN
  Administered 2020-01-13: 1000 mL

## 2020-01-13 MED ORDER — BUPIVACAINE-EPINEPHRINE (PF) 0.25% -1:200000 IJ SOLN
INTRAMUSCULAR | Status: AC
  Filled 2020-01-13: qty 30

## 2020-01-13 MED ORDER — MIDAZOLAM HCL 5 MG/5ML IJ SOLN
INTRAMUSCULAR | Status: DC | PRN
  Administered 2020-01-13: 2 mg via INTRAVENOUS

## 2020-01-13 MED ORDER — LIDOCAINE 2% (20 MG/ML) 5 ML SYRINGE
INTRAMUSCULAR | Status: DC | PRN
  Administered 2020-01-13: 80 mg via INTRAVENOUS

## 2020-01-13 MED ORDER — DIPHENHYDRAMINE HCL 50 MG/ML IJ SOLN: 12.5000 mg | Freq: Four times a day (QID) | INTRAMUSCULAR | Status: DC | PRN

## 2020-01-13 MED ORDER — HEPARIN SODIUM (PORCINE) 1000 UNIT/ML IJ SOLN
INTRAMUSCULAR | Status: AC
  Filled 2020-01-13: qty 1

## 2020-01-13 MED ORDER — FENTANYL CITRATE (PF) 250 MCG/5ML IJ SOLN
INTRAMUSCULAR | Status: AC
  Filled 2020-01-13: qty 5

## 2020-01-13 MED ORDER — ZOLPIDEM TARTRATE 5 MG PO TABS
5.0000 mg | ORAL_TABLET | Freq: Every evening | ORAL | Status: DC | PRN
  Administered 2020-01-13: 23:00:00 5 mg via ORAL
  Filled 2020-01-13: qty 1

## 2020-01-13 MED ORDER — TRAMADOL HCL 50 MG PO TABS: 50.0000 mg | ORAL_TABLET | Freq: Four times a day (QID) | ORAL | 0 refills | Status: AC | PRN

## 2020-01-13 MED ORDER — PANTOPRAZOLE SODIUM 40 MG PO TBEC
40.0000 mg | DELAYED_RELEASE_TABLET | Freq: Every day | ORAL | Status: DC
  Administered 2020-01-13 – 2020-01-14 (×2): 40 mg via ORAL
  Filled 2020-01-13 (×2): qty 1

## 2020-01-13 MED ORDER — CEFAZOLIN SODIUM-DEXTROSE 2-4 GM/100ML-% IV SOLN
2.0000 g | Freq: Once | INTRAVENOUS | Status: AC
  Administered 2020-01-13: 08:00:00 2 g via INTRAVENOUS
  Filled 2020-01-13: qty 100

## 2020-01-13 MED ORDER — FLEET ENEMA 7-19 GM/118ML RE ENEM: 1.0000 | ENEMA | Freq: Once | RECTAL | Status: DC

## 2020-01-13 MED ORDER — PROMETHAZINE HCL 25 MG/ML IJ SOLN: 6.2500 mg | INTRAMUSCULAR | Status: DC | PRN

## 2020-01-13 MED ORDER — PHENYLEPHRINE 40 MCG/ML (10ML) SYRINGE FOR IV PUSH (FOR BLOOD PRESSURE SUPPORT)
PREFILLED_SYRINGE | INTRAVENOUS | Status: AC
  Filled 2020-01-13: qty 10

## 2020-01-13 MED ORDER — HYDROMORPHONE HCL 1 MG/ML IJ SOLN
0.2500 mg | INTRAMUSCULAR | Status: DC | PRN
  Administered 2020-01-13 (×2): 0.5 mg via INTRAVENOUS

## 2020-01-13 MED ORDER — INDIGOTINDISULFONATE SODIUM 8 MG/ML IJ SOLN
INTRAMUSCULAR | Status: AC
  Filled 2020-01-13: qty 5

## 2020-01-13 MED ORDER — BELLADONNA ALKALOIDS-OPIUM 16.2-60 MG RE SUPP
1.0000 | Freq: Four times a day (QID) | RECTAL | Status: DC | PRN
  Administered 2020-01-13 (×2): 1 via RECTAL
  Filled 2020-01-13: qty 1

## 2020-01-13 MED ORDER — BELLADONNA ALKALOIDS-OPIUM 16.2-60 MG RE SUPP
RECTAL | Status: AC
  Filled 2020-01-13: qty 1

## 2020-01-13 MED ORDER — CEFAZOLIN SODIUM-DEXTROSE 1-4 GM/50ML-% IV SOLN
1.0000 g | Freq: Three times a day (TID) | INTRAVENOUS | Status: AC
  Administered 2020-01-13 (×2): 1 g via INTRAVENOUS
  Filled 2020-01-13 (×2): qty 50

## 2020-01-13 MED ORDER — PHENYLEPHRINE HCL (PRESSORS) 10 MG/ML IV SOLN
INTRAVENOUS | Status: AC
  Filled 2020-01-13: qty 1

## 2020-01-13 MED ORDER — MIDAZOLAM HCL 2 MG/2ML IJ SOLN
0.5000 mg | Freq: Once | INTRAMUSCULAR | Status: AC | PRN
  Administered 2020-01-13: 10:00:00 2 mg via INTRAVENOUS

## 2020-01-13 MED ORDER — SCOPOLAMINE 1 MG/3DAYS TD PT72
MEDICATED_PATCH | TRANSDERMAL | Status: AC
  Filled 2020-01-13: qty 1

## 2020-01-13 MED ORDER — FENTANYL CITRATE (PF) 250 MCG/5ML IJ SOLN
INTRAMUSCULAR | Status: DC | PRN
  Administered 2020-01-13: 100 ug via INTRAVENOUS
  Administered 2020-01-13 (×3): 50 ug via INTRAVENOUS

## 2020-01-13 MED ORDER — BACITRACIN-NEOMYCIN-POLYMYXIN 400-5-5000 EX OINT: 1.0000 "application " | TOPICAL_OINTMENT | Freq: Three times a day (TID) | CUTANEOUS | Status: DC | PRN

## 2020-01-13 MED ORDER — BUPIVACAINE-EPINEPHRINE 0.25% -1:200000 IJ SOLN
INTRAMUSCULAR | Status: DC | PRN
  Administered 2020-01-13: 30 mL

## 2020-01-13 MED ORDER — SULFAMETHOXAZOLE-TRIMETHOPRIM 800-160 MG PO TABS: 1.0000 | ORAL_TABLET | Freq: Two times a day (BID) | ORAL | 0 refills | Status: DC

## 2020-01-13 MED ORDER — ONDANSETRON HCL 4 MG/2ML IJ SOLN
INTRAMUSCULAR | Status: DC | PRN
  Administered 2020-01-13 (×2): 4 mg via INTRAVENOUS

## 2020-01-13 MED ORDER — LACTATED RINGERS IV SOLN: INTRAVENOUS | Status: DC

## 2020-01-13 MED ORDER — MECLIZINE HCL 25 MG PO TABS: 25.0000 mg | ORAL_TABLET | Freq: Three times a day (TID) | ORAL | Status: DC | PRN

## 2020-01-13 MED ORDER — DOCUSATE SODIUM 100 MG PO CAPS
100.0000 mg | ORAL_CAPSULE | Freq: Two times a day (BID) | ORAL | Status: DC
  Administered 2020-01-13 – 2020-01-14 (×3): 100 mg via ORAL
  Filled 2020-01-13 (×4): qty 1

## 2020-01-13 MED ORDER — INDIGOTINDISULFONATE SODIUM 8 MG/ML IJ SOLN
INTRAMUSCULAR | Status: DC | PRN
  Administered 2020-01-13: 5 mL via INTRAVENOUS

## 2020-01-13 MED ORDER — DEXAMETHASONE SODIUM PHOSPHATE 10 MG/ML IJ SOLN
INTRAMUSCULAR | Status: DC | PRN
  Administered 2020-01-13: 10 mg via INTRAVENOUS

## 2020-01-13 MED ORDER — OXYCODONE HCL 5 MG PO TABS: 5.0000 mg | ORAL_TABLET | Freq: Once | ORAL | Status: DC | PRN

## 2020-01-13 MED ORDER — MORPHINE SULFATE (PF) 2 MG/ML IV SOLN
2.0000 mg | INTRAVENOUS | Status: DC | PRN
  Administered 2020-01-13: 23:00:00 2 mg via INTRAVENOUS
  Filled 2020-01-13: qty 1

## 2020-01-13 MED ORDER — ACETAMINOPHEN 325 MG PO TABS: 650.0000 mg | ORAL_TABLET | ORAL | Status: DC | PRN

## 2020-01-13 MED ORDER — KETOROLAC TROMETHAMINE 15 MG/ML IJ SOLN
15.0000 mg | Freq: Four times a day (QID) | INTRAMUSCULAR | Status: DC
  Administered 2020-01-13 – 2020-01-14 (×5): 15 mg via INTRAVENOUS
  Filled 2020-01-13 (×5): qty 1

## 2020-01-13 MED ORDER — DIPHENHYDRAMINE HCL 12.5 MG/5ML PO ELIX: 12.5000 mg | ORAL_SOLUTION | Freq: Four times a day (QID) | ORAL | Status: DC | PRN

## 2020-01-13 MED ORDER — HYDROMORPHONE HCL 1 MG/ML IJ SOLN
INTRAMUSCULAR | Status: AC
  Filled 2020-01-13: qty 2

## 2020-01-13 MED ORDER — PHENYLEPHRINE 40 MCG/ML (10ML) SYRINGE FOR IV PUSH (FOR BLOOD PRESSURE SUPPORT)
PREFILLED_SYRINGE | INTRAVENOUS | Status: DC | PRN
  Administered 2020-01-13: 80 ug via INTRAVENOUS

## 2020-01-13 MED ORDER — PHENYLEPHRINE HCL-NACL 10-0.9 MG/250ML-% IV SOLN
INTRAVENOUS | Status: DC | PRN
  Administered 2020-01-13: 50 ug/min via INTRAVENOUS

## 2020-01-13 MED ORDER — ALBUTEROL SULFATE HFA 108 (90 BASE) MCG/ACT IN AERS
INHALATION_SPRAY | RESPIRATORY_TRACT | Status: DC | PRN
  Administered 2020-01-13: 3 via RESPIRATORY_TRACT

## 2020-01-13 MED ORDER — LACTATED RINGERS IV SOLN: INTRAVENOUS | Status: DC | PRN

## 2020-01-13 SURGICAL SUPPLY — 67 items
APPLICATOR COTTON TIP 6 STRL (MISCELLANEOUS) ×2 IMPLANT
APPLICATOR COTTON TIP 6IN STRL (MISCELLANEOUS) ×3
BOOT SUTURE VASCULAR YLW (MISCELLANEOUS) ×3
CATH SILICON 18FR 30CC (CATHETERS) ×6 IMPLANT
CATH SILICONE 16FRX5CC (CATHETERS) ×3 IMPLANT
CHLORAPREP W/TINT 26 (MISCELLANEOUS) ×3 IMPLANT
CLAMP SUTURE YELLOW 5 PAIRS (MISCELLANEOUS) ×2 IMPLANT
CLIP VESOLOCK LG 6/CT PURPLE (CLIP) ×6 IMPLANT
CNTNR URN SCR LID CUP LEK RST (MISCELLANEOUS) ×2 IMPLANT
CONT SPEC 4OZ STRL OR WHT (MISCELLANEOUS) ×3
COVER SURGICAL LIGHT HANDLE (MISCELLANEOUS) ×3 IMPLANT
COVER TIP SHEARS 8 DVNC (MISCELLANEOUS) ×2 IMPLANT
COVER TIP SHEARS 8MM DA VINCI (MISCELLANEOUS) ×3
COVER WAND RF STERILE (DRAPES) IMPLANT
CUTTER ECHEON FLEX ENDO 45 340 (ENDOMECHANICALS) ×3 IMPLANT
DECANTER SPIKE VIAL GLASS SM (MISCELLANEOUS) ×3 IMPLANT
DERMABOND ADVANCED (GAUZE/BANDAGES/DRESSINGS) ×1
DERMABOND ADVANCED .7 DNX12 (GAUZE/BANDAGES/DRESSINGS) ×2 IMPLANT
DRAIN CHANNEL RND F F (WOUND CARE) IMPLANT
DRAPE ARM DVNC X/XI (DISPOSABLE) ×8 IMPLANT
DRAPE COLUMN DVNC XI (DISPOSABLE) ×2 IMPLANT
DRAPE DA VINCI XI ARM (DISPOSABLE) ×12
DRAPE DA VINCI XI COLUMN (DISPOSABLE) ×3
DRAPE INCISE 23X17 IOBAN STRL (DRAPES) ×1
DRAPE INCISE IOBAN 23X17 STRL (DRAPES) ×2 IMPLANT
DRAPE SURG IRRIG POUCH 19X23 (DRAPES) ×3 IMPLANT
DRSG TEGADERM 4X4.75 (GAUZE/BANDAGES/DRESSINGS) ×3 IMPLANT
ELECT REM PT RETURN 15FT ADLT (MISCELLANEOUS) ×3 IMPLANT
GAUZE SPONGE 4X4 12PLY STRL (GAUZE/BANDAGES/DRESSINGS) ×3 IMPLANT
GLOVE BIOGEL PI IND STRL 7.0 (GLOVE) ×2 IMPLANT
GLOVE BIOGEL PI IND STRL 7.5 (GLOVE) ×8 IMPLANT
GLOVE BIOGEL PI INDICATOR 7.0 (GLOVE) ×1
GLOVE BIOGEL PI INDICATOR 7.5 (GLOVE) ×4
GLOVE SURG SS PI 6.5 STRL IVOR (GLOVE) ×3 IMPLANT
GOWN STRL REUS W/TWL LRG LVL3 (GOWN DISPOSABLE) ×12 IMPLANT
HEMOSTAT SURGICEL 2X3 (HEMOSTASIS) ×3 IMPLANT
HOLDER FOLEY CATH W/STRAP (MISCELLANEOUS) ×3 IMPLANT
IRRIG SUCT STRYKERFLOW 2 WTIP (MISCELLANEOUS) ×3
IRRIGATION SUCT STRKRFLW 2 WTP (MISCELLANEOUS) ×2 IMPLANT
IV LACTATED RINGERS 1000ML (IV SOLUTION) ×3 IMPLANT
KIT TURNOVER KIT A (KITS) ×3 IMPLANT
NDL SAFETY ECLIPSE 18X1.5 (NEEDLE) ×2 IMPLANT
NEEDLE HYPO 18GX1.5 SHARP (NEEDLE) ×3
PACK ROBOT UROLOGY CUSTOM (CUSTOM PROCEDURE TRAY) ×3 IMPLANT
PENCIL SMOKE EVACUATOR (MISCELLANEOUS) IMPLANT
SEAL CANN UNIV 5-8 DVNC XI (MISCELLANEOUS) ×8 IMPLANT
SEAL XI 5MM-8MM UNIVERSAL (MISCELLANEOUS) ×12
SET TUBE SMOKE EVAC HIGH FLOW (TUBING) ×3 IMPLANT
SOLUTION ELECTROLUBE (MISCELLANEOUS) ×3 IMPLANT
STAPLE RELOAD 45 GRN (STAPLE) ×2 IMPLANT
STAPLE RELOAD 45MM GREEN (STAPLE) ×3
SUT ETHILON 3 0 PS 1 (SUTURE) ×3 IMPLANT
SUT MNCRL 3 0 RB1 (SUTURE) ×2 IMPLANT
SUT MNCRL 3 0 VIOLET RB1 (SUTURE) ×2 IMPLANT
SUT MNCRL AB 4-0 PS2 18 (SUTURE) ×6 IMPLANT
SUT MONOCRYL 3 0 RB1 (SUTURE) ×6
SUT VIC AB 0 CT1 27 (SUTURE) ×3
SUT VIC AB 0 CT1 27XBRD ANTBC (SUTURE) ×2 IMPLANT
SUT VIC AB 0 UR5 27 (SUTURE) ×3 IMPLANT
SUT VIC AB 2-0 SH 27 (SUTURE) ×6
SUT VIC AB 2-0 SH 27X BRD (SUTURE) ×4 IMPLANT
SUT VICRYL 0 UR6 27IN ABS (SUTURE) ×6 IMPLANT
SYR 27GX1/2 1ML LL SAFETY (SYRINGE) ×3 IMPLANT
TAG SUTURE CLAMP YLW 5PR (MISCELLANEOUS) ×2
TOWEL OR NON WOVEN STRL DISP B (DISPOSABLE) ×3 IMPLANT
TROCAR XCEL NON-BLD 5MMX100MML (ENDOMECHANICALS) ×3 IMPLANT
WATER STERILE IRR 1000ML POUR (IV SOLUTION) ×3 IMPLANT

## 2020-01-13 NOTE — Transfer of Care (Signed)
Immediate Anesthesia Transfer of Care Note  Patient: Kurt Marquez  Procedure(s) Performed: XI ROBOTIC ASSISTED LAPAROSCOPIC RADICAL PROSTATECTOMY LEVEL 2 (N/A ) LYMPHADENECTOMY, PELVIC (Bilateral )  Patient Location: PACU  Anesthesia Type:General  Level of Consciousness: awake, alert  and confused  Airway & Oxygen Therapy: Patient Spontanous Breathing and Patient connected to face mask oxygen  Post-op Assessment: Report given to RN, Post -op Vital signs reviewed and stable and Patient moving all extremities  Post vital signs: Reviewed and stable  Last Vitals:  Vitals Value Taken Time  BP 119/50 01/13/20 1016  Temp    Pulse 118 01/13/20 1017  Resp 16 01/13/20 1019  SpO2 100 % 01/13/20 1017  Vitals shown include unvalidated device data.  Last Pain:  Vitals:   01/13/20 0600  TempSrc:   PainSc: 0-No pain         Complications: No apparent anesthesia complications

## 2020-01-13 NOTE — Anesthesia Postprocedure Evaluation (Signed)
Anesthesia Post Note  Patient: Kurt Marquez  Procedure(s) Performed: XI ROBOTIC ASSISTED LAPAROSCOPIC RADICAL PROSTATECTOMY LEVEL 2 (N/A ) LYMPHADENECTOMY, PELVIC (Bilateral )     Patient location during evaluation: PACU Anesthesia Type: General Level of consciousness: awake and alert Pain management: pain level controlled Vital Signs Assessment: post-procedure vital signs reviewed and stable Respiratory status: spontaneous breathing, nonlabored ventilation and respiratory function stable Cardiovascular status: blood pressure returned to baseline and stable Postop Assessment: no apparent nausea or vomiting Anesthetic complications: no    Last Vitals:  Vitals:   01/13/20 1015 01/13/20 1115  BP: (!) 119/50 (!) 148/72  Pulse:  (!) 110  Resp: (!) 21 18  Temp: 36.5 C 36.5 C  SpO2: 100% 100%    Last Pain:  Vitals:   01/13/20 1115  TempSrc:   PainSc: 0-No pain                 Lynda Rainwater

## 2020-01-13 NOTE — Op Note (Signed)
Preoperative diagnosis: Clinically localized adenocarcinoma of the prostate (clinical stage T2 N0 M0)  Postoperative diagnosis: Clinically localized adenocarcinoma of the prostate (clinical stage T2 N0 M0)  Procedure:  1. Robotic assisted laparoscopic radical prostatectomy (bilateral nerve sparing) 2. Bilateral robotic assisted laparoscopic pelvic lymphadenectomy  Surgeon: [name]. M.D.  Assistant: Debbrah Alar, PA-C  An assistant was required for this surgical procedure.  The duties of the assistant included but were not limited to suctioning, passing suture, camera manipulation, retraction. This procedure would not be able to be performed without an Environmental consultant.  Anesthesia: General  Complications: None  EBL: 50 mL  IVF:  1500 mL crystalloid  Specimens: 1. Prostate and seminal vesicles 2. Right pelvic lymph nodes 3. Left pelvic lymph nodes  Disposition of specimens: Pathology  Drains: 1. 20 Fr coude catheter 2. # 19 Blake pelvic drain  Indication: Kurt Marquez is a 59 y.o. year old patient with clinically localized prostate cancer.  After a thorough review of the management options for treatment of prostate cancer, he elected to proceed with surgical therapy and the above procedure(s).  We have discussed the potential benefits and risks of the procedure, side effects of the proposed treatment, the likelihood of the patient achieving the goals of the procedure, and any potential problems that might occur during the procedure or recuperation. Informed consent has been obtained.  Description of procedure:  The patient was taken to the operating room and a general anesthetic was administered. He was given preoperative antibiotics, placed in the dorsal lithotomy position, and prepped and draped in the usual sterile fashion. Next a preoperative timeout was performed. A urethral catheter was placed into the bladder and a site was selected near the umbilicus for placement  of the camera port. This was placed using a standard open Hassan technique which allowed entry into the peritoneal cavity under direct vision and without difficulty. An 8 mm robotic port was placed and a pneumoperitoneum established. The camera was then used to inspect the abdomen and there was no evidence of any intra-abdominal injuries or other abnormalities. The remaining abdominal ports were then placed. 8 mm robotic ports were placed in the right lower quadrant, left lower quadrant, and far left lateral abdominal wall. A 5 mm port was placed in the right upper quadrant and a 12 mm port was placed in the right lateral abdominal wall for laparoscopic assistance. All ports were placed under direct vision without difficulty. The surgical cart was then docked.   Utilizing the cautery scissors, the bladder was reflected posteriorly allowing entry into the space of Retzius and identification of the endopelvic fascia and prostate. The periprostatic fat was then removed from the prostate allowing full exposure of the endopelvic fascia. The endopelvic fascia was then incised from the apex back to the base of the prostate bilaterally and the underlying levator muscle fibers were swept laterally off the prostate thereby isolating the dorsal venous complex. The dorsal vein was then stapled and divided with a 45 mm Flex Echelon stapler. Attention then turned to the bladder neck which was divided anteriorly thereby allowing entry into the bladder and exposure of the urethral catheter. The catheter balloon was deflated and the catheter was brought into the operative field and used to retract the prostate anteriorly. The posterior bladder neck was then examined and was divided allowing further dissection between the bladder and prostate posteriorly until the vasa deferentia and seminal vessels were identified. The vasa deferentia were isolated, divided, and lifted anteriorly.  The seminal vesicles were dissected down to their  tips with care to control the seminal vascular arterial blood supply. These structures were then lifted anteriorly and the space between Denonvillier's fascia and the anterior rectum was developed with a combination of sharp and blunt dissection. This isolated the vascular pedicles of the prostate.  The lateral prostatic fascia was then sharply incised allowing release of the neurovascular bundles bilaterally. The vascular pedicles of the prostate were then ligated with Weck clips between the prostate and neurovascular bundles and divided with sharp cold scissor dissection resulting in neurovascular bundle preservation. The neurovascular bundles were then separated off the apex of the prostate and urethra bilaterally.  The urethra was then sharply transected allowing the prostate specimen to be disarticulated. The pelvis was copiously irrigated and hemostasis was ensured. There was no evidence for rectal injury.  Attention then turned to the right pelvic sidewall. The fibrofatty tissue between the external iliac vein, confluence of the iliac vessels, hypogastric artery, and Cooper's ligament was dissected free from the pelvic sidewall with care to preserve the obturator nerve. Weck clips were used for lymphostasis and hemostasis. An identical procedure was performed on the contralateral side and the lymphatic packets were removed for permanent pathologic analysis.  Attention then turned to the urethral anastomosis. A 2-0 Vicryl slip knot was placed between Denonvillier's fascia, the posterior bladder neck, and the posterior urethra to reapproximate these structures. A double-armed 3-0 Monocryl suture was then used to perform a 360 running tension-free anastomosis between the bladder neck and urethra. A new urethral catheter was then placed into the bladder and irrigated. There were no blood clots within the bladder and the anastomosis appeared to be watertight. A #19 Blake drain was then brought through the  left lateral 8 mm port site and positioned appropriately within the pelvis. It was secured to the skin with a nylon suture. The surgical cart was then undocked. The right lateral 12 mm port site was closed at the fascial level with a 0 Vicryl suture placed laparoscopically. All remaining ports were then removed under direct vision. The prostate specimen was removed intact within the Endopouch retrieval bag via the periumbilical camera port site. This fascial opening was closed with two running 0 Vicryl sutures. 0.25% Marcaine was then injected into all port sites and all incisions were reapproximated at the skin level with 4-0 Monocryl subcuticular sutures and Dermabond. The patient appeared to tolerate the procedure well and without complications. The patient was able to be extubated and transferred to the recovery unit in satisfactory condition.   [name] MD

## 2020-01-13 NOTE — Anesthesia Procedure Notes (Signed)
Procedure Name: Intubation Date/Time: 01/13/2020 7:30 AM Performed by: Mitzie Na, CRNA Pre-anesthesia Checklist: Patient identified, Emergency Drugs available, Suction available and Patient being monitored Patient Re-evaluated:Patient Re-evaluated prior to induction Oxygen Delivery Method: Circle system utilized Preoxygenation: Pre-oxygenation with 100% oxygen Induction Type: IV induction and Rapid sequence Laryngoscope Size: Mac and 4 Grade View: Grade II Tube type: Oral Tube size: 7.5 mm Number of attempts: 1 Airway Equipment and Method: Stylet and Oral airway Placement Confirmation: ETT inserted through vocal cords under direct vision,  positive ETCO2 and breath sounds checked- equal and bilateral Secured at: 25 cm Tube secured with: Tape Dental Injury: Teeth and Oropharynx as per pre-operative assessment

## 2020-01-13 NOTE — Progress Notes (Signed)
Patient ID: Kurt Marquez, male   DOB: 01-Oct-1961, 59 y.o.   MRN: [id number]  Post-op note  Subjective: The patient is doing well.  No complaints.   Objective: Vital signs in last 24 hours: Temp:  [97.7 F (36.5 C)-98.1 F (36.7 C)] 98.1 F (36.7 C) (03/04 1154) Pulse Rate:  [81-124] 103 (03/04 1154) Resp:  [16-21] 18 (03/04 1154) BP: (119-148)/(50-81) 135/57 (03/04 1154) SpO2:  [98 %-100 %] 98 % (03/04 1154)  Intake/Output from previous day: No intake/output data recorded. Intake/Output this shift: Total I/O In: 1600 [I.V.:1500; IV Piggyback:100] Out: 260 [Urine:150; Drains:60; Blood:50]  Physical Exam:  General: Alert and oriented. Abdomen: Soft, Nondistended. Incisions: Clean and dry. GU: Urine clear.  Lab Results: Recent Labs    01/13/20 1030  HGB 14.0  HCT 45.0    Assessment/Plan: POD#0   1) Continue to monitor, ambulate, IS   [name]. MD   LOS: 0 days   [name] 01/13/2020, 2:15 PM

## 2020-01-13 NOTE — Anesthesia Preprocedure Evaluation (Signed)
Anesthesia Evaluation  Patient identified by MRN, date of birth, ID band Patient awake    Reviewed: Allergy & Precautions, NPO status , Patient's Chart, lab work & pertinent test results  Airway Mallampati: II  TM Distance: >3 FB Neck ROM: Full    Dental no notable dental hx.    Pulmonary asthma ,    Pulmonary exam normal breath sounds clear to auscultation       Cardiovascular hypertension, Pt. on medications negative cardio ROS Normal cardiovascular exam Rhythm:Regular Rate:Normal     Neuro/Psych negative neurological ROS  negative psych ROS   GI/Hepatic negative GI ROS, Neg liver ROS,   Endo/Other  negative endocrine ROS  Renal/GU negative Renal ROS  negative genitourinary   Musculoskeletal negative musculoskeletal ROS (+)   Abdominal (+) + obese,   Peds negative pediatric ROS (+)  Hematology negative hematology ROS (+)   Anesthesia Other Findings Prostate Cancer  Reproductive/Obstetrics negative OB ROS                             Anesthesia Physical Anesthesia Plan  ASA: III  Anesthesia Plan: General   Post-op Pain Management:    Induction: Intravenous  PONV Risk Score and Plan: 2 and Ondansetron, Midazolam and Treatment may vary due to age or medical condition  Airway Management Planned: Oral ETT  Additional Equipment:   Intra-op Plan:   Post-operative Plan: Extubation in OR  Informed Consent: I have reviewed the patients History and Physical, chart, labs and discussed the procedure including the risks, benefits and alternatives for the proposed anesthesia with the patient or authorized representative who has indicated his/her understanding and acceptance.     Dental advisory given  Plan Discussed with: CRNA  Anesthesia Plan Comments:         Anesthesia Quick Evaluation

## 2020-01-14 DIAGNOSIS — C61 Malignant neoplasm of prostate: Secondary | ICD-10-CM | POA: Diagnosis not present

## 2020-01-14 LAB — HEMOGLOBIN AND HEMATOCRIT, BLOOD
HCT: 38.9 % — ABNORMAL LOW (ref 39.0–52.0)
Hemoglobin: 12.3 g/dL — ABNORMAL LOW (ref 13.0–17.0)

## 2020-01-14 MED ORDER — MENTHOL 3 MG MT LOZG
1.0000 | LOZENGE | OROMUCOSAL | Status: DC | PRN
  Administered 2020-01-14: 13:00:00 3 mg via ORAL
  Filled 2020-01-14: qty 9

## 2020-01-14 MED ORDER — TRAMADOL HCL 50 MG PO TABS: 50.0000 mg | ORAL_TABLET | Freq: Four times a day (QID) | ORAL | Status: DC | PRN

## 2020-01-14 MED ORDER — BISACODYL 10 MG RE SUPP
10.0000 mg | Freq: Once | RECTAL | Status: AC
  Administered 2020-01-14: 10 mg via RECTAL
  Filled 2020-01-14: qty 1

## 2020-01-14 MED ORDER — CHLORHEXIDINE GLUCONATE CLOTH 2 % EX PADS
6.0000 | MEDICATED_PAD | Freq: Every day | CUTANEOUS | Status: DC
  Administered 2020-01-14: 6 via TOPICAL

## 2020-01-14 NOTE — Progress Notes (Signed)
Pt discharged home today per Dr. Esmond Camper. Kurt Marquez. Pt's IV site D/C'd and WDL. Pt's VSS. JP drain removed per order and site WDL. Pt and wife educated on foley catheter care at home using teach back method. Return demonstration successful. Pt provided with home medication list, discharge instructions and prescriptions. Verbalized understanding. Pt left floor via WC in stable condition accompanied by NT.

## 2020-01-14 NOTE — Plan of Care (Signed)
  Problem: Education: Goal: Knowledge of the procedure and recovery process will improve Outcome: Completed/Met   Problem: Bowel/Gastric: Goal: Gastrointestinal status for postoperative course will improve Outcome: Completed/Met   Problem: Pain Management: Goal: General experience of comfort will improve Outcome: Completed/Met   Problem: Skin Integrity: Goal: Demonstration of wound healing without infection will improve Outcome: Completed/Met   Problem: Urinary Elimination: Goal: Ability to avoid or minimize complications of infection will improve Outcome: Completed/Met Goal: Ability to achieve and maintain urine output will improve Outcome: Completed/Met Goal: Home care management will improve Outcome: Completed/Met   Problem: Education: Goal: Knowledge of General Education information will improve Description: Including pain rating scale, medication(s)/side effects and non-pharmacologic comfort measures Outcome: Completed/Met   Problem: Health Behavior/Discharge Planning: Goal: Ability to manage health-related needs will improve 01/14/2020 1329 by Annie Sable, RN Outcome: Completed/Met 01/14/2020 1152 by Annie Sable, RN Outcome: Progressing   Problem: Clinical Measurements: Goal: Ability to maintain clinical measurements within normal limits will improve Outcome: Completed/Met Goal: Will remain free from infection Outcome: Completed/Met Goal: Diagnostic test results will improve Outcome: Completed/Met Goal: Respiratory complications will improve Outcome: Completed/Met Goal: Cardiovascular complication will be avoided Outcome: Completed/Met   Problem: Activity: Goal: Risk for activity intolerance will decrease Outcome: Completed/Met   Problem: Nutrition: Goal: Adequate nutrition will be maintained Outcome: Completed/Met   Problem: Coping: Goal: Level of anxiety will decrease Outcome: Completed/Met   Problem: Elimination: Goal: Will not experience  complications related to bowel motility Outcome: Completed/Met Goal: Will not experience complications related to urinary retention Outcome: Completed/Met   Problem: Pain Managment: Goal: General experience of comfort will improve Outcome: Completed/Met   Problem: Safety: Goal: Ability to remain free from injury will improve Outcome: Completed/Met   Problem: Skin Integrity: Goal: Risk for impaired skin integrity will decrease Outcome: Completed/Met

## 2020-01-14 NOTE — Discharge Summary (Signed)
  Date of admission: 01/13/2020  Date of discharge: 01/14/2020  Admission diagnosis: Prostate Cancer  Discharge diagnosis: Prostate Cancer  History and Physical: For full details, please see admission history and physical. Briefly, Kurt Marquez is a 59 y.o. gentleman with localized prostate cancer.  After discussing management/treatment options, he elected to proceed with surgical treatment.  Hospital Course: Kurt Marquez was taken to the operating room on 01/13/2020 and underwent a robotic assisted laparoscopic radical prostatectomy. He tolerated this procedure well and without complications. Postoperatively, he was able to be transferred to a regular hospital room following recovery from anesthesia.  He was able to begin ambulating the night of surgery. He remained hemodynamically stable overnight.  He had excellent urine output with appropriately minimal output from his pelvic drain and his pelvic drain was removed on POD #1.  He was transitioned to oral pain medication, tolerated a clear liquid diet, and had met all discharge criteria and was able to be discharged home later on POD#1.  Laboratory values:  Recent Labs    01/13/20 1030 01/14/20 0544  HGB 14.0 12.3*  HCT 45.0 38.9*    Disposition: Home  Discharge instruction: He was instructed to be ambulatory but to refrain from heavy lifting, strenuous activity, or driving. He was instructed on urethral catheter care.  Discharge medications:   Allergies as of 01/14/2020      Reactions   Ivp Dye [iodinated Diagnostic Agents] Other (See Comments)   Metrizamide Other (See Comments)   Iodine-131 Other (See Comments)   Says itchy sensation and hives with contrast for renal testing   Latex    Rash   Pollen Extract       Medication List    TAKE these medications   lisinopril 20 MG tablet Commonly known as: ZESTRIL Take 20 mg by mouth daily.   meclizine 25 MG tablet Commonly known as: ANTIVERT Take 25 mg by mouth 3 (three) times  daily as needed for dizziness or nausea.   metroNIDAZOLE 0.75 % gel Commonly known as: METROGEL Apply 1 application topically 2 (two) times daily as needed (rosacea).   omeprazole 20 MG capsule Commonly known as: PRILOSEC Take 20 mg by mouth daily.   sulfamethoxazole-trimethoprim 800-160 MG tablet Commonly known as: BACTRIM DS Take 1 tablet by mouth 2 (two) times daily. Start the day prior to foley removal appointment   traMADol 50 MG tablet Commonly known as: Ultram Take 1-2 tablets (50-100 mg total) by mouth every 6 (six) hours as needed for moderate pain or severe pain.   triamcinolone cream 0.1 % Commonly known as: KENALOG Apply 1 application topically 2 (two) times daily as needed (rash).       Followup: He will followup in 1 week for catheter removal and to discuss his surgical pathology results.

## 2020-01-14 NOTE — Plan of Care (Signed)
  Problem: Education: Goal: Knowledge of the procedure and recovery process will improve Outcome: Completed/Met   Problem: Bowel/Gastric: Goal: Gastrointestinal status for postoperative course will improve Outcome: Completed/Met   Problem: Pain Management: Goal: General experience of comfort will improve Outcome: Completed/Met   Problem: Skin Integrity: Goal: Demonstration of wound healing without infection will improve Outcome: Completed/Met   Problem: Urinary Elimination: Goal: Ability to avoid or minimize complications of infection will improve Outcome: Completed/Met Goal: Ability to achieve and maintain urine output will improve Outcome: Completed/Met Goal: Home care management will improve Outcome: Completed/Met   Problem: Education: Goal: Knowledge of General Education information will improve Description: Including pain rating scale, medication(s)/side effects and non-pharmacologic comfort measures Outcome: Completed/Met   Problem: Health Behavior/Discharge Planning: Goal: Ability to manage health-related needs will improve Outcome: Progressing   Problem: Clinical Measurements: Goal: Ability to maintain clinical measurements within normal limits will improve Outcome: Completed/Met Goal: Will remain free from infection Outcome: Completed/Met Goal: Diagnostic test results will improve Outcome: Completed/Met Goal: Respiratory complications will improve Outcome: Completed/Met Goal: Cardiovascular complication will be avoided Outcome: Completed/Met   Problem: Activity: Goal: Risk for activity intolerance will decrease Outcome: Completed/Met   Problem: Nutrition: Goal: Adequate nutrition will be maintained Outcome: Completed/Met   Problem: Coping: Goal: Level of anxiety will decrease Outcome: Completed/Met   Problem: Elimination: Goal: Will not experience complications related to bowel motility Outcome: Completed/Met Goal: Will not experience complications  related to urinary retention Outcome: Completed/Met   Problem: Pain Managment: Goal: General experience of comfort will improve Outcome: Completed/Met   Problem: Safety: Goal: Ability to remain free from injury will improve Outcome: Completed/Met   Problem: Skin Integrity: Goal: Risk for impaired skin integrity will decrease Outcome: Completed/Met

## 2020-01-14 NOTE — Progress Notes (Signed)
Patient ID: Kurt Marquez, male   DOB: 06-14-1961, 59 y.o.   MRN: [id number]  1 Day Post-Op Subjective: The patient is doing well.  No nausea or vomiting. Pain is adequately controlled.  Objective: Vital signs in last 24 hours: Temp:  [97.5 F (36.4 C)-98.7 F (37.1 C)] 97.5 F (36.4 C) (03/05 0615) Pulse Rate:  [80-124] 80 (03/05 0615) Resp:  [16-21] 16 (03/05 0615) BP: (91-148)/(50-72) 106/62 (03/05 0615) SpO2:  [94 %-100 %] 94 % (03/05 0615) Weight:  [id number] kg] 114 kg (03/04 1441)  Intake/Output from previous day: 03/04 0701 - 03/05 0700 In: 4727.3 [P.O.:720; I.V.:3857.3; IV Piggyback:150] Out: [id number] [Urine:3175; Drains:145; Blood:50] Intake/Output this shift: No intake/output data recorded.  Physical Exam:  General: Alert and oriented. CV: RRR Lungs: Clear bilaterally. GI: Soft, Nondistended. Incisions: Clean, dry, and intact Urine: Clear Extremities: Nontender, no erythema, no edema.  Lab Results: Recent Labs    01/13/20 1030 01/14/20 0544  HGB 14.0 12.3*  HCT 45.0 38.9*      Assessment/Plan: POD# 1 s/p robotic prostatectomy.  1) SL IVF 2) Ambulate, Incentive spirometry 3) Transition to oral pain medication 4) Dulcolax suppository 5) D/C pelvic drain 6) Plan for likely discharge later today   [name]. MD   LOS: 0 days   [name] 01/14/2020, 7:50 AM

## 2020-01-19 LAB — SURGICAL PATHOLOGY

## 2020-01-24 ENCOUNTER — Encounter: Payer: Self-pay | Admitting: Medical Oncology

## 2020-03-18 ENCOUNTER — Ambulatory Visit: Payer: 59 | Attending: Internal Medicine

## 2020-03-18 ENCOUNTER — Ambulatory Visit: Payer: Self-pay

## 2020-03-18 DIAGNOSIS — Z23 Encounter for immunization: Secondary | ICD-10-CM

## 2020-03-18 NOTE — Progress Notes (Signed)
   Covid-19 Vaccination Clinic  Name:  Kurt Marquez    MRN: [id number] DOB: September 04, 1961  03/18/2020  [name] was observed post Covid-19 immunization for 15 minutes without incident. He was provided with Vaccine Information Sheet and instruction to access the V-Safe system.   [name] was instructed to call 911 with any severe reactions post vaccine: Marland Kitchen Difficulty breathing  . Swelling of face and throat  . A fast heartbeat  . A bad rash all over body  . Dizziness and weakness   Immunizations Administered    Name Date Dose VIS Date Route   Pfizer COVID-19 Vaccine 03/18/2020 11:27 AM 0.3 mL 01/05/2019 Intramuscular   Manufacturer: [place]   Lot: [id number]   [place]: [id number]

## 2020-04-11 ENCOUNTER — Ambulatory Visit: Payer: 59 | Attending: Internal Medicine

## 2020-04-11 DIAGNOSIS — Z23 Encounter for immunization: Secondary | ICD-10-CM

## 2020-04-11 NOTE — Progress Notes (Signed)
   Covid-19 Vaccination Clinic  Name:  Kurt Marquez    MRN: [id number] DOB: 1961-04-28  04/11/2020  Kurt Marquez was observed post Covid-19 immunization for 15 minutes without incident. He was provided with Vaccine Information Sheet and instruction to access the V-Safe system.   Kurt Marquez was instructed to call 911 with any severe reactions post vaccine: Marland Kitchen Difficulty breathing  . Swelling of face and throat  . A fast heartbeat  . A bad rash all over body  . Dizziness and weakness   Immunizations Administered    Name Date Dose VIS Date Route   Pfizer COVID-19 Vaccine 04/11/2020 11:22 AM 0.3 mL 01/05/2019 Intramuscular   Manufacturer: [employer]   Lot: [id number]   [place]: [id number]

## 2020-10-03 ENCOUNTER — Encounter: Payer: Self-pay | Admitting: Cardiology

## 2020-10-03 ENCOUNTER — Ambulatory Visit (INDEPENDENT_AMBULATORY_CARE_PROVIDER_SITE_OTHER): Payer: 59 | Admitting: Cardiology

## 2020-10-03 ENCOUNTER — Other Ambulatory Visit: Payer: Self-pay

## 2020-10-03 VITALS — BP 134/82 | HR 80 | Ht 70.5 in | Wt 253.6 lb

## 2020-10-03 DIAGNOSIS — Z8249 Family history of ischemic heart disease and other diseases of the circulatory system: Secondary | ICD-10-CM | POA: Diagnosis not present

## 2020-10-03 DIAGNOSIS — C61 Malignant neoplasm of prostate: Secondary | ICD-10-CM | POA: Diagnosis not present

## 2020-10-03 DIAGNOSIS — Z0000 Encounter for general adult medical examination without abnormal findings: Secondary | ICD-10-CM | POA: Diagnosis not present

## 2020-10-03 NOTE — Progress Notes (Signed)
Cardiology Office Note:    Date:  10/03/2020   ID:  AARONJAMES KELSAY, DOB 08-28-1961, MRN [id number]  PCP:  Lawerance Cruel, MD  [place] Cardiologist:  No primary care provider on file.  [facility] Electrophysiologist:  None   Referring MD: Lawerance Cruel, MD     History of Present Illness:    Kurt Marquez is a 59 y.o. male here for the evaluation of family history coronary artery disease at the request of Dr. Melinda Crutch.  Has a family history of atrial fibrillation.  Father's with atrial fibrillation, 74 year old in [place]. Started to see cards in his 57's  Paternal grandfather died at age 66 with stroke. Nephew died suddenly, was college Pharmacist, community.  Autopsy inconclusive  Hemoglobin 14.5 TSH 1.4  He was questioning whether or not he had any vessel anomalies.  Has prostate cancer followed by urology blood pressure under good control.  Vitiligo followed by dermatology.  Overall he is not having any symptoms.  Sometimes after a large meal he may feel his heart increase in speed slightly and feels slightly sweaty.  Overall no anginal symptoms no syncope no bleeding.  Past Medical History:  Diagnosis Date  . Asthma   . Hypertension   . Prostate cancer [facility])     Past Surgical History:  Procedure Laterality Date  . HERNIA REPAIR    . LYMPHADENECTOMY Bilateral 01/13/2020   Procedure: LYMPHADENECTOMY, PELVIC;  Surgeon: Raynelle Bring, MD;  Location: [facility];  Service: Urology;  Laterality: Bilateral;  . PROSTATE BIOPSY    . ROBOT ASSISTED LAPAROSCOPIC RADICAL PROSTATECTOMY N/A 01/13/2020   Procedure: XI ROBOTIC ASSISTED LAPAROSCOPIC RADICAL PROSTATECTOMY LEVEL 2;  Surgeon: Raynelle Bring, MD;  Location: [facility];  Service: Urology;  Laterality: N/A;  . VARICOCELE EXCISION      Current Medications: Current Meds  Medication Sig  . doxycycline (VIBRAMYCIN) 100 MG capsule Take 100 mg by mouth as needed. Eye flareups  . lisinopril (ZESTRIL) 20 MG tablet Take 20 mg by  mouth daily.   . meclizine (ANTIVERT) 25 MG tablet Take 25 mg by mouth 3 (three) times daily as needed for dizziness or nausea.   . metroNIDAZOLE (METROGEL) 0.75 % gel Apply 1 application topically 2 (two) times daily as needed (rosacea).   . mupirocin ointment (BACTROBAN) 2 % Apply 1 application topically as needed.  Marland Kitchen omeprazole (PRILOSEC) 20 MG capsule Take 20 mg by mouth daily.   . tacrolimus (PROTOPIC) 0.1 % ointment Apply topically daily. Apply to affected area  . tadalafil (CIALIS) 20 MG tablet Take 20 mg by mouth as needed.  . traMADol (ULTRAM) 50 MG tablet Take 1-2 tablets (50-100 mg total) by mouth every 6 (six) hours as needed for moderate pain or severe pain.  Marland Kitchen triamcinolone cream (KENALOG) 0.1 % Apply 1 application topically 2 (two) times daily as needed (rash).      Allergies:   Ivp dye [iodinated diagnostic agents], Metrizamide, Iodine-131, Latex, and Pollen extract   Social History   Socioeconomic History  . Marital status: Married    Spouse name: Levada Dy  . Number of children: 3  . Years of education: Not on file  . Highest education level: Not on file  Occupational History  . Occupation: [employer]    Comment: trade manager/full time  Tobacco Use  . Smoking status: Never Smoker  . Smokeless tobacco: Never Used  Vaping Use  . Vaping Use: Never used  Substance and Sexual Activity  .  Alcohol use: No  . Drug use: No  . Sexual activity: Yes  Other Topics Concern  . Not on file  Social History Narrative  . Not on file   Social Determinants of Health   Financial Resource Strain:   . Difficulty of Paying Living Expenses: Not on file  Food Insecurity:   . Worried About Charity fundraiser in the Last Year: Not on file  . Ran Out of Food in the Last Year: Not on file  Transportation Needs:   . Lack of Transportation (Medical): Not on file  . Lack of Transportation (Non-Medical): Not on file  Physical Activity:   . Days of Exercise per Week: Not on  file  . Minutes of Exercise per Session: Not on file  Stress:   . Feeling of Stress : Not on file  Social Connections:   . Frequency of Communication with Friends and Family: Not on file  . Frequency of Social Gatherings with Friends and Family: Not on file  . Attends Religious Services: Not on file  . Active Member of Clubs or Organizations: Not on file  . Attends Archivist Meetings: Not on file  . Marital Status: Not on file     Family History: The patient's family history includes Pancreatic cancer (age of onset: 26) in his maternal uncle; Prostate cancer in his father. There is no history of Breast cancer or Colon cancer.  ROS:   Please see the history of present illness.     All other systems reviewed and are negative.  EKGs/Labs/Other Studies Reviewed:    The following studies were reviewed today: Prior clinic notes  EKG:  EKG is  ordered today.  The ekg ordered today demonstrates sinus rhythm 80 no other changes normal intervals  Recent Labs: 01/06/2020: BUN 16; Creatinine, Ser 0.94; Platelets 314; Potassium 4.3; Sodium 139 01/14/2020: Hemoglobin 12.3  Recent Lipid Panel No results found for: CHOL, TRIG, HDL, CHOLHDL, VLDL, LDLCALC, LDLDIRECT   Risk Assessment/Calculations:       Physical Exam:    VS:  BP 134/82   Pulse 80   Ht 5' 10.5" (1.791 m)   Wt 253 lb 9.6 oz (115 kg)   SpO2 99%   BMI 35.87 kg/m     Wt Readings from Last 3 Encounters:  10/03/20 253 lb 9.6 oz (115 kg)  01/13/20 251 lb 5.2 oz (114 kg)  01/06/20 253 lb (114.8 kg)     GEN:  Well nourished, well developed in no acute distress HEENT: Normal NECK: No JVD; No carotid bruits LYMPHATICS: No lymphadenopathy CARDIAC: RRR, no murmurs, rubs, gallops RESPIRATORY:  Clear to auscultation without rales, wheezing or rhonchi  ABDOMEN: Soft, non-tender, non-distended MUSCULOSKELETAL:  No edema; No deformity  SKIN: Warm and dry NEUROLOGIC:  Alert and oriented x 3 PSYCHIATRIC:  Normal  affect   ASSESSMENT:    1. Family history of cardiovascular disease   2. Wellness examination   3. Prostate cancer [facility])    PLAN:    In order of problems listed above:  Family history of heart disease -Father with atrial fibrillation.  Nephew unexpected sudden death. -We will check a coronary calcium score.  Asymptomatic. -If abnormal, we will likely try to pursue increased medical management in addition to diet and exercise, likely add Crestor for instance. -Encourage healthy eating habits, exercise.  He does have some back issues from playing football younger.  Bicycle excellent.  Status post prostatectomy for prostate cancer -Stable. -His wife had mastectomy  last year.  We will let him know the results of testing.  Shared Decision Making/Informed Consent        Medication Adjustments/Labs and Tests Ordered: Current medicines are reviewed at length with the patient today.  Concerns regarding medicines are outlined above.  Orders Placed This Encounter  Procedures  . CT CARDIAC SCORING  . EKG 12-Lead   No orders of the defined types were placed in this encounter.   Patient Instructions  Medication Instructions:  The current medical regimen is effective;  continue present plan and medications.  *If you need a refill on your cardiac medications before your next appointment, please call your pharmacy*  Testing/Procedures: Your physician has requested that you have Coronary Calcium Score which is completed by CT scan. Cardiac computed tomography (CT) is a painless test that uses an x-ray machine to take clear, detailed pictures of your heart.  It is done at this office and there are no restrictions prior to the test.  The cost is $150 due at the time of the test.  Follow-Up: At [facility], you and your health needs are our priority.  As part of our continuing mission to provide you with exceptional heart care, we have created designated Provider Care Teams.  These  Care Teams include your primary Cardiologist (physician) and Advanced Practice Providers (APPs -  Physician Assistants and Nurse Practitioners) who all work together to provide you with the care you need, when you need it.  We recommend signing up for the patient portal called "MyChart".  Sign up information is provided on this After Visit Summary.  MyChart is used to connect with patients for Virtual Visits (Telemedicine).  Patients are able to view lab/test results, encounter notes, upcoming appointments, etc.  Non-urgent messages can be sent to your provider as well.   To learn more about what you can do with MyChart, go to [email or web address].    Follow up as needed after the above testing.  Thank you for choosing [facility]!!        Signed, [name], MD  10/03/2020 12:15 PM    [facility]

## 2020-10-03 NOTE — Patient Instructions (Signed)
Medication Instructions:  The current medical regimen is effective;  continue present plan and medications.  *If you need a refill on your cardiac medications before your next appointment, please call your pharmacy*  Testing/Procedures: Your physician has requested that you have Coronary Calcium Score which is completed by CT scan. Cardiac computed tomography (CT) is a painless test that uses an x-ray machine to take clear, detailed pictures of your heart.  It is done at this office and there are no restrictions prior to the test.  The cost is $150 due at the time of the test.  Follow-Up: At [facility], you and your health needs are our priority.  As part of our continuing mission to provide you with exceptional heart care, we have created designated Provider Care Teams.  These Care Teams include your primary Cardiologist (physician) and Advanced Practice Providers (APPs -  Physician Assistants and Nurse Practitioners) who all work together to provide you with the care you need, when you need it.  We recommend signing up for the patient portal called "MyChart".  Sign up information is provided on this After Visit Summary.  MyChart is used to connect with patients for Virtual Visits (Telemedicine).  Patients are able to view lab/test results, encounter notes, upcoming appointments, etc.  Non-urgent messages can be sent to your provider as well.   To learn more about what you can do with MyChart, go to [email or web address].    Follow up as needed after the above testing.  Thank you for choosing [facility]!!

## 2020-10-18 ENCOUNTER — Other Ambulatory Visit: Payer: Self-pay

## 2020-10-18 ENCOUNTER — Ambulatory Visit (INDEPENDENT_AMBULATORY_CARE_PROVIDER_SITE_OTHER)
Admission: RE | Admit: 2020-10-18 | Discharge: 2020-10-18 | Disposition: A | Discharge status: Home / Self Care | Payer: Self-pay | Source: Ambulatory Visit | Attending: Cardiology | Admitting: Cardiology

## 2020-10-18 DIAGNOSIS — Z0000 Encounter for general adult medical examination without abnormal findings: Secondary | ICD-10-CM

## 2020-10-18 DIAGNOSIS — Z8249 Family history of ischemic heart disease and other diseases of the circulatory system: Secondary | ICD-10-CM

## 2020-10-19 ENCOUNTER — Telehealth: Payer: Self-pay | Admitting: Cardiology

## 2020-10-19 DIAGNOSIS — J929 Pleural plaque without asbestos: Secondary | ICD-10-CM

## 2020-10-19 NOTE — Telephone Encounter (Signed)
Lets go ahead and refer to pulmonary for further explanation.  Thank you, [name], MD

## 2020-10-19 NOTE — Telephone Encounter (Signed)
Patient returning phone call regarding results

## 2020-10-19 NOTE — Telephone Encounter (Signed)
° ° ° °  Pt said he saw Dr. Marlou Porch comment about his CT yesterday but he would like to speak with a nurse he has some questions

## 2020-10-19 NOTE — Telephone Encounter (Signed)
Spoke with pt and reviewed documented information.  Pt has decided he would like to be seen by pulmonary.  Referral has been placed and pt is aware he will be called to be scheduled.  He thanked me for the several calls back today and information reviewed.

## 2020-10-19 NOTE — Telephone Encounter (Signed)
Kurt Pain, MD  10/19/2020 12:17 PM EST      Excellent results. Low risk. Score is 0. 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 Left lung base clear. No effusions.  Advised I will have Dr Marlou Porch review and call back with information.

## 2020-10-19 NOTE — Telephone Encounter (Signed)
Called to speak with pt and left message  - these plaques are exclusively cause by exposure to asbestos dust or fibers and can show up 10 to 30 yrs after exposure.  They general do not cause any lose in lung function nor require treatment however per Dr Marlou Porch, we can refer him to pulmonary for further evaluation if he would like.  Requested pt c/b to make [place] aware if he would want to see pulmonary.

## 2020-11-30 ENCOUNTER — Other Ambulatory Visit: Payer: Self-pay

## 2020-11-30 ENCOUNTER — Encounter: Payer: Self-pay | Admitting: Internal Medicine

## 2020-11-30 ENCOUNTER — Ambulatory Visit (INDEPENDENT_AMBULATORY_CARE_PROVIDER_SITE_OTHER): Payer: 59 | Admitting: Internal Medicine

## 2020-11-30 VITALS — BP 118/78 | HR 82 | Temp 97.3°F | Ht 70.5 in | Wt 255.4 lb

## 2020-11-30 DIAGNOSIS — J920 Pleural plaque with presence of asbestos: Secondary | ICD-10-CM | POA: Diagnosis not present
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
## 2022-08-18 IMAGING — CT CT CARDIAC CORONARY ARTERY CALCIUM SCORE
3 series · 14 of 20 positions shown, 15 images · non-contrast
Comparison: None.
COMPARISON: None.

Addendum:
EXAM:
OVER-READ INTERPRETATION  CT CHEST

The following report is an over-read performed by radiologist Dr.
Bradaris Nesto [REDACTED] on 10/18/2020. This over-read
does not include interpretation of cardiac or coronary anatomy or
pathology. The coronary calcium score interpretation by the
cardiologist is attached.
CLINICAL DATA: Risk stratification 59 year old with family history
of CAD
Coronary Calcium Score
TECHNIQUE: The patient was scanned on a Siemens Force scanner. Axial
non-contrast 3 mm slices were carried out through the heart. The
data set was analyzed on a dedicated work station and scored using
the Agatson method.

[Series 5: casc 3.0 bv41 2 bestdiast 74 % · axial · 0.45mm/px · z∈[+984,+1047]mm · 4 of 36 slices shown, 5 images]
[im 8/36  vessel]
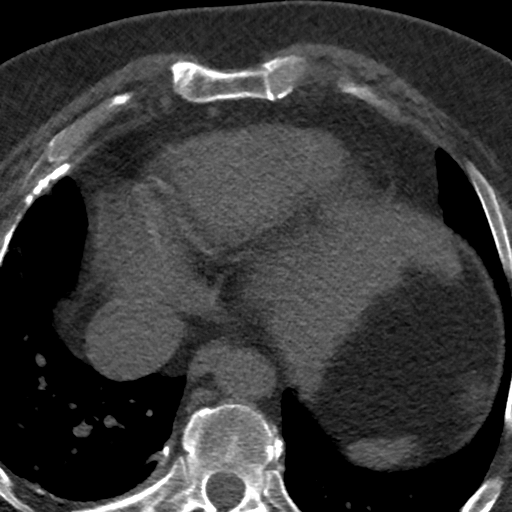
[im 8/36  lung]
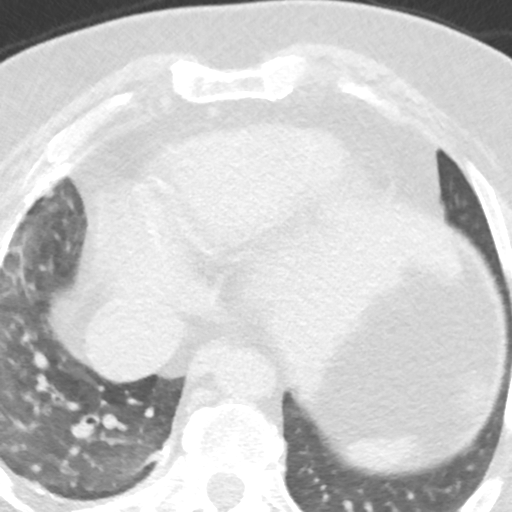
[im 15/36  vessel]
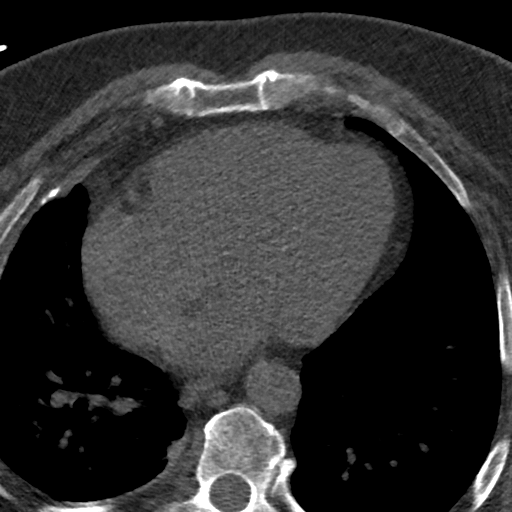
[im 22/36  vessel]
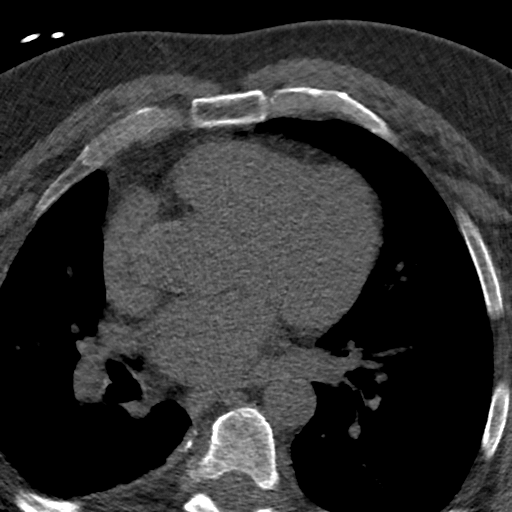
[im 29/36  vessel]
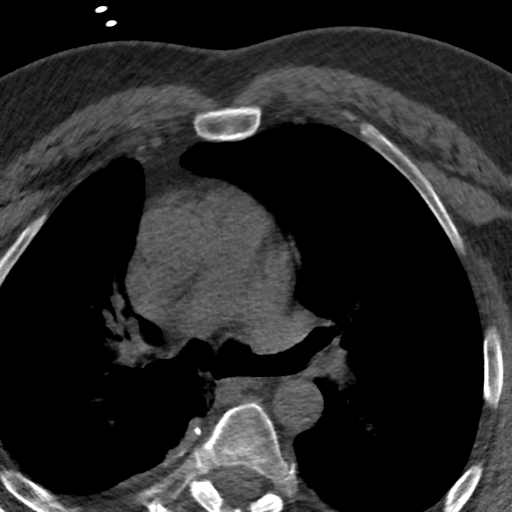

[Series 6: lung 74 % · axial · 0.63mm/px · z∈[+978,+1050]mm · 5 of 36 slices shown]
[im 6/36  lung]
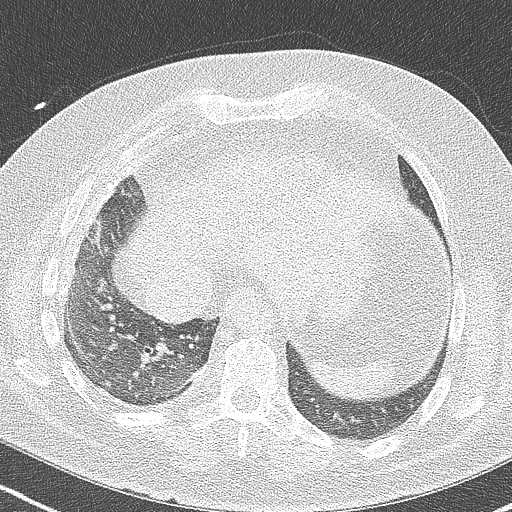
[im 12/36  lung]
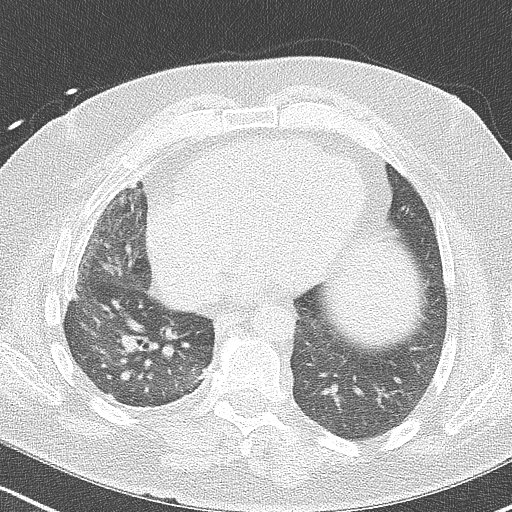
[im 18/36  lung]
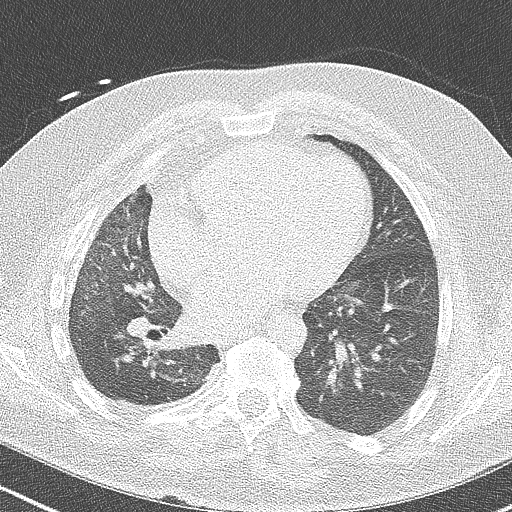
[im 24/36  lung]
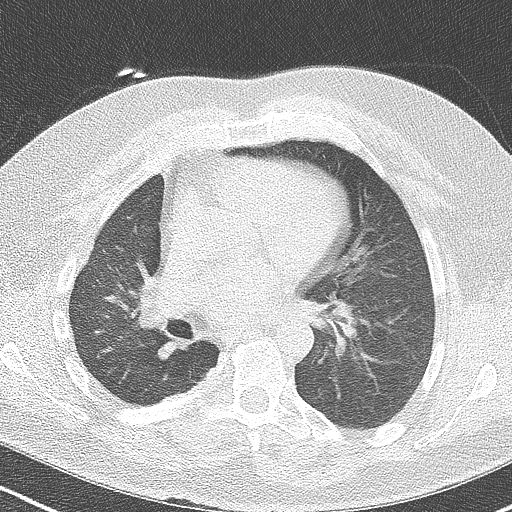
[im 30/36  lung]
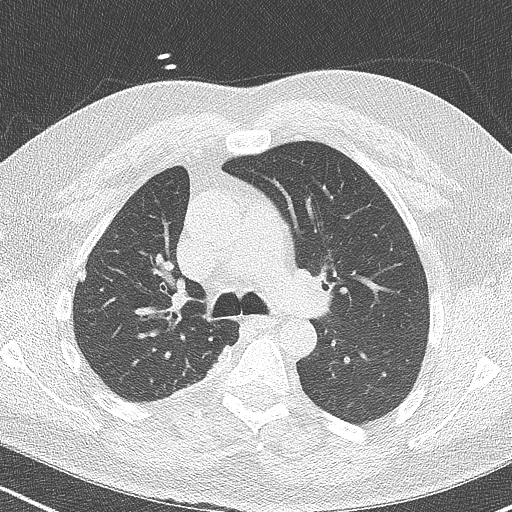

[Series 7: lung st 74 % · axial · 0.63mm/px · z∈[+978,+1050]mm · 5 of 36 slices shown]
[im 6/36  lung]
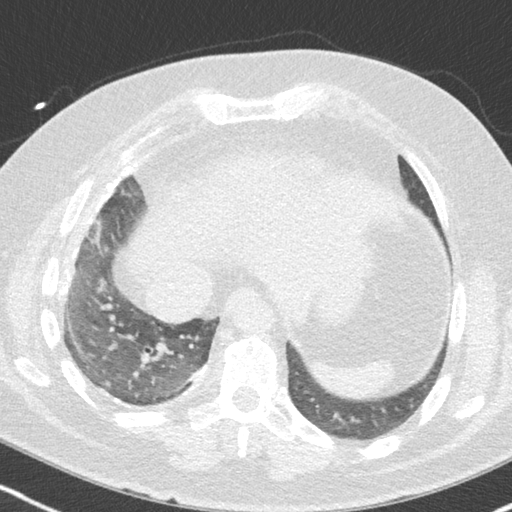
[im 12/36  lung]
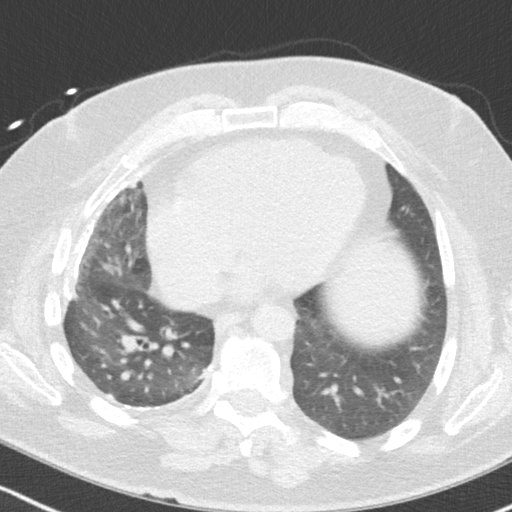
[im 18/36  lung]
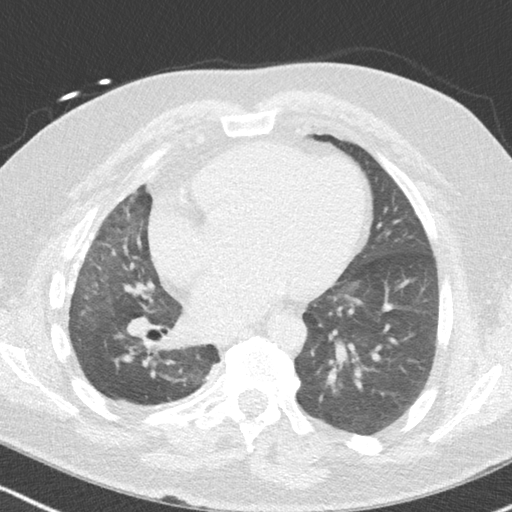
[im 24/36  lung]
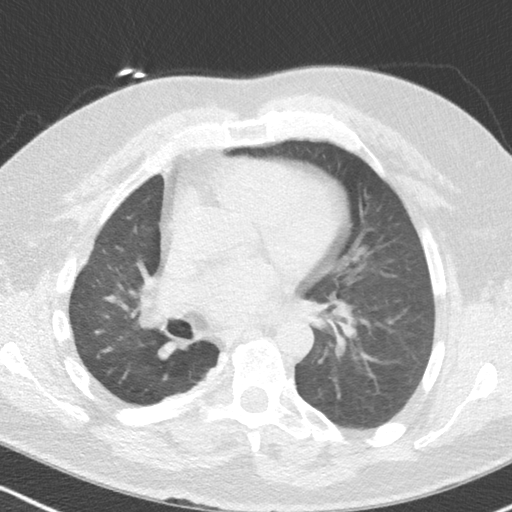
[im 30/36  lung]
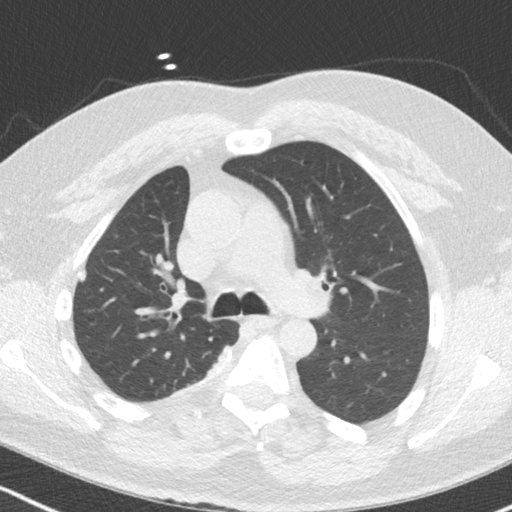

[14 of 20 positions shown; findings below may reference images not displayed]

FINDINGS: Vascular: Heart is normal size.  Aorta normal caliber.

Mediastinum/Nodes: No adenopathy.

Lungs/Pleura: Partially calcified pleural plaques noted in the right
mid and lower chest. Left lung base clear. No effusions.

Upper Abdomen: Imaging into the upper abdomen demonstrates no acute
findings.

Musculoskeletal: Chest wall soft tissues are unremarkable. No acute
bony abnormality.
IMPRESSION: No acute extra cardiac abnormality.

Calcified right pleural plaques.
FINDINGS: Non-cardiac: See separate report from [REDACTED].

Ascending Aorta: Normal 35 mm.

Pericardium: Normal

Coronary arteries: Normal
IMPRESSION: 1. Coronary calcium score of 0.  Low risk.

*** End of Addendum ***
EXAM:
OVER-READ INTERPRETATION  CT CHEST

The following report is an over-read performed by radiologist Dr.
Bradaris Nesto [REDACTED] on 10/18/2020. This over-read
does not include interpretation of cardiac or coronary anatomy or
pathology. The coronary calcium score interpretation by the
cardiologist is attached.
FINDINGS: Vascular: Heart is normal size.  Aorta normal caliber.

Mediastinum/Nodes: No adenopathy.

Lungs/Pleura: Partially calcified pleural plaques noted in the right
mid and lower chest. Left lung base clear. No effusions.

Upper Abdomen: Imaging into the upper abdomen demonstrates no acute
findings.

Musculoskeletal: Chest wall soft tissues are unremarkable. No acute
bony abnormality.
IMPRESSION: No acute extra cardiac abnormality.

Calcified right pleural plaques.

## 2023-11-14 DIAGNOSIS — C61 Malignant neoplasm of prostate: Secondary | ICD-10-CM | POA: Diagnosis not present

## 2023-11-21 DIAGNOSIS — N393 Stress incontinence (female) (male): Secondary | ICD-10-CM | POA: Diagnosis not present

## 2023-11-21 DIAGNOSIS — C61 Malignant neoplasm of prostate: Secondary | ICD-10-CM | POA: Diagnosis not present

## 2023-12-16 DIAGNOSIS — Z08 Encounter for follow-up examination after completed treatment for malignant neoplasm: Secondary | ICD-10-CM | POA: Diagnosis not present

## 2023-12-16 DIAGNOSIS — D225 Melanocytic nevi of trunk: Secondary | ICD-10-CM | POA: Diagnosis not present

## 2023-12-16 DIAGNOSIS — Z1283 Encounter for screening for malignant neoplasm of skin: Secondary | ICD-10-CM | POA: Diagnosis not present

## 2023-12-16 DIAGNOSIS — Z85828 Personal history of other malignant neoplasm of skin: Secondary | ICD-10-CM | POA: Diagnosis not present

## 2024-01-12 DIAGNOSIS — N644 Mastodynia: Secondary | ICD-10-CM | POA: Diagnosis not present

## 2024-01-26 ENCOUNTER — Other Ambulatory Visit: Payer: Self-pay | Admitting: Nurse Practitioner

## 2024-01-26 DIAGNOSIS — E221 Hyperprolactinemia: Secondary | ICD-10-CM

## 2024-01-27 DIAGNOSIS — L821 Other seborrheic keratosis: Secondary | ICD-10-CM | POA: Diagnosis not present

## 2024-01-27 DIAGNOSIS — L304 Erythema intertrigo: Secondary | ICD-10-CM | POA: Diagnosis not present

## 2024-01-27 DIAGNOSIS — L918 Other hypertrophic disorders of the skin: Secondary | ICD-10-CM | POA: Diagnosis not present

## 2024-01-28 DIAGNOSIS — E221 Hyperprolactinemia: Secondary | ICD-10-CM | POA: Diagnosis not present

## 2024-02-03 DIAGNOSIS — M79671 Pain in right foot: Secondary | ICD-10-CM | POA: Diagnosis not present

## 2024-02-12 ENCOUNTER — Ambulatory Visit
Admission: RE | Admit: 2024-02-12 | Discharge: 2024-02-12 | Disposition: A | Discharge status: Home / Self Care | Source: Ambulatory Visit | Attending: Nurse Practitioner | Admitting: Nurse Practitioner

## 2024-02-12 DIAGNOSIS — E221 Hyperprolactinemia: Secondary | ICD-10-CM

## 2024-02-12 DIAGNOSIS — D352 Benign neoplasm of pituitary gland: Secondary | ICD-10-CM | POA: Diagnosis not present

## 2024-02-12 MED ORDER — GADOPICLENOL 0.5 MMOL/ML IV SOLN
10.0000 mL | Freq: Once | INTRAVENOUS | Status: AC | PRN
  Administered 2024-02-12: 10 mL via INTRAVENOUS

## 2024-02-25 DIAGNOSIS — M79671 Pain in right foot: Secondary | ICD-10-CM | POA: Diagnosis not present

## 2024-02-25 DIAGNOSIS — M7661 Achilles tendinitis, right leg: Secondary | ICD-10-CM | POA: Diagnosis not present

## 2024-04-21 DIAGNOSIS — B078 Other viral warts: Secondary | ICD-10-CM | POA: Diagnosis not present

## 2024-04-21 DIAGNOSIS — L821 Other seborrheic keratosis: Secondary | ICD-10-CM | POA: Diagnosis not present

## 2024-05-11 DIAGNOSIS — D352 Benign neoplasm of pituitary gland: Secondary | ICD-10-CM | POA: Diagnosis not present

## 2024-05-19 DIAGNOSIS — C61 Malignant neoplasm of prostate: Secondary | ICD-10-CM | POA: Diagnosis not present

## 2024-06-02 DIAGNOSIS — N5201 Erectile dysfunction due to arterial insufficiency: Secondary | ICD-10-CM | POA: Diagnosis not present

## 2024-06-02 DIAGNOSIS — N393 Stress incontinence (female) (male): Secondary | ICD-10-CM | POA: Diagnosis not present

## 2024-06-02 DIAGNOSIS — C61 Malignant neoplasm of prostate: Secondary | ICD-10-CM | POA: Diagnosis not present

## 2024-07-01 DIAGNOSIS — Z23 Encounter for immunization: Secondary | ICD-10-CM | POA: Diagnosis not present

## 2024-07-01 DIAGNOSIS — D352 Benign neoplasm of pituitary gland: Secondary | ICD-10-CM | POA: Diagnosis not present

## 2024-07-01 DIAGNOSIS — R0981 Nasal congestion: Secondary | ICD-10-CM | POA: Diagnosis not present

## 2024-07-13 DIAGNOSIS — G4730 Sleep apnea, unspecified: Secondary | ICD-10-CM | POA: Diagnosis not present

## 2024-07-13 DIAGNOSIS — J3489 Other specified disorders of nose and nasal sinuses: Secondary | ICD-10-CM | POA: Diagnosis not present

## 2024-07-13 DIAGNOSIS — K148 Other diseases of tongue: Secondary | ICD-10-CM | POA: Diagnosis not present

## 2024-08-02 DIAGNOSIS — L80 Vitiligo: Secondary | ICD-10-CM | POA: Diagnosis not present

## 2024-08-20 DIAGNOSIS — D101 Benign neoplasm of tongue: Secondary | ICD-10-CM | POA: Diagnosis not present

## 2024-10-04 DIAGNOSIS — D352 Benign neoplasm of pituitary gland: Secondary | ICD-10-CM | POA: Diagnosis not present

## 2024-10-19 DIAGNOSIS — Z13220 Encounter for screening for lipoid disorders: Secondary | ICD-10-CM | POA: Diagnosis not present

## 2024-10-19 DIAGNOSIS — N529 Male erectile dysfunction, unspecified: Secondary | ICD-10-CM | POA: Diagnosis not present

## 2024-10-19 DIAGNOSIS — Z0000 Encounter for general adult medical examination without abnormal findings: Secondary | ICD-10-CM | POA: Diagnosis not present

## 2024-10-19 DIAGNOSIS — G4733 Obstructive sleep apnea (adult) (pediatric): Secondary | ICD-10-CM | POA: Diagnosis not present

## 2024-10-19 DIAGNOSIS — R7303 Prediabetes: Secondary | ICD-10-CM | POA: Diagnosis not present

## 2024-10-19 DIAGNOSIS — M26609 Unspecified temporomandibular joint disorder, unspecified side: Secondary | ICD-10-CM | POA: Diagnosis not present

## 2024-10-19 DIAGNOSIS — E559 Vitamin D deficiency, unspecified: Secondary | ICD-10-CM | POA: Diagnosis not present

## 2024-10-19 DIAGNOSIS — Z131 Encounter for screening for diabetes mellitus: Secondary | ICD-10-CM | POA: Diagnosis not present

## 2024-10-19 DIAGNOSIS — I10 Essential (primary) hypertension: Secondary | ICD-10-CM | POA: Diagnosis not present

## 2024-10-28 DIAGNOSIS — G4733 Obstructive sleep apnea (adult) (pediatric): Secondary | ICD-10-CM | POA: Diagnosis not present

## 2024-10-28 DIAGNOSIS — L80 Vitiligo: Secondary | ICD-10-CM | POA: Diagnosis not present

## 2024-10-28 DIAGNOSIS — L820 Inflamed seborrheic keratosis: Secondary | ICD-10-CM | POA: Diagnosis not present

## 2024-10-28 DIAGNOSIS — L209 Atopic dermatitis, unspecified: Secondary | ICD-10-CM | POA: Diagnosis not present
